# Patient Record
Sex: Female | Born: 1955 | Race: White | Hispanic: No | Marital: Married | Smoking: Never smoker
Health system: Southern US, Community
[De-identification: ages and names within clinical notes are randomized; demographics above are authoritative.]

## PROBLEM LIST (undated history)

## (undated) DIAGNOSIS — Z8489 Family history of other specified conditions: Secondary | ICD-10-CM

## (undated) DIAGNOSIS — Z85828 Personal history of other malignant neoplasm of skin: Secondary | ICD-10-CM

## (undated) DIAGNOSIS — K219 Gastro-esophageal reflux disease without esophagitis: Secondary | ICD-10-CM

## (undated) HISTORY — PX: ABDOMINAL HYSTERECTOMY: SHX81

## (undated) HISTORY — DX: Personal history of other malignant neoplasm of skin: Z85.828

## (undated) HISTORY — PX: SHOULDER SURGERY: SHX246

---

## 1978-04-07 DIAGNOSIS — O039 Complete or unspecified spontaneous abortion without complication: Secondary | ICD-10-CM

## 1978-04-07 HISTORY — DX: Complete or unspecified spontaneous abortion without complication: O03.9

## 1999-08-15 ENCOUNTER — Ambulatory Visit (HOSPITAL_COMMUNITY): Admission: RE | Admit: 1999-08-15 | Discharge: 1999-08-15 | Payer: Self-pay | Admitting: Obstetrics and Gynecology

## 2001-09-15 ENCOUNTER — Inpatient Hospital Stay (HOSPITAL_COMMUNITY): Admission: RE | Admit: 2001-09-15 | Discharge: 2001-09-17 | Payer: Self-pay | Admitting: Obstetrics and Gynecology

## 2004-05-23 ENCOUNTER — Ambulatory Visit: Payer: Self-pay | Admitting: Internal Medicine

## 2004-07-22 ENCOUNTER — Ambulatory Visit: Payer: Self-pay | Admitting: Internal Medicine

## 2005-05-06 ENCOUNTER — Ambulatory Visit: Payer: Self-pay | Admitting: Internal Medicine

## 2005-07-25 ENCOUNTER — Ambulatory Visit: Payer: Self-pay | Admitting: Internal Medicine

## 2005-10-23 ENCOUNTER — Ambulatory Visit: Payer: Self-pay | Admitting: Gastroenterology

## 2006-04-22 DIAGNOSIS — C4491 Basal cell carcinoma of skin, unspecified: Secondary | ICD-10-CM

## 2006-04-22 HISTORY — DX: Basal cell carcinoma of skin, unspecified: C44.91

## 2006-07-12 ENCOUNTER — Emergency Department: Payer: Self-pay | Admitting: Emergency Medicine

## 2006-07-13 ENCOUNTER — Ambulatory Visit: Payer: Self-pay | Admitting: Internal Medicine

## 2006-07-27 ENCOUNTER — Ambulatory Visit: Payer: Self-pay | Admitting: Diagnostic Radiology

## 2007-08-02 ENCOUNTER — Ambulatory Visit: Payer: Self-pay | Admitting: Internal Medicine

## 2008-01-19 ENCOUNTER — Ambulatory Visit: Payer: Self-pay | Admitting: Internal Medicine

## 2008-05-29 ENCOUNTER — Ambulatory Visit: Payer: Self-pay | Admitting: Surgery

## 2008-08-09 ENCOUNTER — Emergency Department: Payer: Self-pay | Admitting: Emergency Medicine

## 2008-11-06 ENCOUNTER — Ambulatory Visit: Payer: Self-pay | Admitting: Internal Medicine

## 2009-11-07 ENCOUNTER — Ambulatory Visit: Payer: Self-pay | Admitting: Internal Medicine

## 2010-04-07 HISTORY — PX: BREAST EXCISIONAL BIOPSY: SUR124

## 2010-11-27 ENCOUNTER — Ambulatory Visit: Payer: Self-pay | Admitting: Internal Medicine

## 2011-12-15 ENCOUNTER — Ambulatory Visit: Payer: Self-pay | Admitting: Internal Medicine

## 2012-12-15 ENCOUNTER — Ambulatory Visit: Payer: Self-pay | Admitting: Internal Medicine

## 2012-12-28 ENCOUNTER — Ambulatory Visit: Payer: Self-pay | Admitting: Internal Medicine

## 2013-07-26 DIAGNOSIS — D239 Other benign neoplasm of skin, unspecified: Secondary | ICD-10-CM

## 2013-07-26 HISTORY — DX: Other benign neoplasm of skin, unspecified: D23.9

## 2013-08-30 DIAGNOSIS — H269 Unspecified cataract: Secondary | ICD-10-CM

## 2013-08-30 DIAGNOSIS — M51369 Other intervertebral disc degeneration, lumbar region without mention of lumbar back pain or lower extremity pain: Secondary | ICD-10-CM

## 2013-08-30 DIAGNOSIS — M5136 Other intervertebral disc degeneration, lumbar region: Secondary | ICD-10-CM

## 2013-08-30 DIAGNOSIS — E785 Hyperlipidemia, unspecified: Secondary | ICD-10-CM

## 2013-08-30 DIAGNOSIS — M5090 Cervical disc disorder, unspecified, unspecified cervical region: Secondary | ICD-10-CM | POA: Insufficient documentation

## 2013-08-30 HISTORY — DX: Other intervertebral disc degeneration, lumbar region: M51.36

## 2013-08-30 HISTORY — DX: Unspecified cataract: H26.9

## 2013-08-30 HISTORY — DX: Other intervertebral disc degeneration, lumbar region without mention of lumbar back pain or lower extremity pain: M51.369

## 2013-08-30 HISTORY — DX: Hyperlipidemia, unspecified: E78.5

## 2013-08-30 HISTORY — DX: Cervical disc disorder, unspecified, unspecified cervical region: M50.90

## 2013-09-01 ENCOUNTER — Ambulatory Visit: Payer: Self-pay | Admitting: Internal Medicine

## 2014-07-12 ENCOUNTER — Ambulatory Visit
Admit: 2014-07-12 | Disposition: A | Discharge status: Home / Self Care | Payer: Self-pay | Attending: Diagnostic Radiology | Admitting: Diagnostic Radiology

## 2014-11-16 ENCOUNTER — Other Ambulatory Visit: Payer: Self-pay | Admitting: Internal Medicine

## 2014-11-16 DIAGNOSIS — R1011 Right upper quadrant pain: Secondary | ICD-10-CM

## 2014-11-22 ENCOUNTER — Ambulatory Visit
Admission: RE | Admit: 2014-11-22 | Discharge: 2014-11-22 | Disposition: A | Discharge status: Home / Self Care | Payer: BC Managed Care – PPO | Source: Ambulatory Visit | Attending: Internal Medicine | Admitting: Internal Medicine

## 2014-11-22 DIAGNOSIS — R1011 Right upper quadrant pain: Secondary | ICD-10-CM | POA: Diagnosis present

## 2014-11-22 DIAGNOSIS — K769 Liver disease, unspecified: Secondary | ICD-10-CM | POA: Insufficient documentation

## 2014-11-22 MED ORDER — TECHNETIUM TC 99M MEBROFENIN IV KIT
5.0000 | PACK | Freq: Once | INTRAVENOUS | Status: DC | PRN
  Administered 2014-11-22: 5.42 via INTRAVENOUS
  Filled 2014-11-22: qty 6

## 2014-11-22 MED ORDER — SINCALIDE 5 MCG IJ SOLR
0.0200 ug/kg | Freq: Once | INTRAMUSCULAR | Status: AC
  Administered 2014-11-22: 1.18 ug via INTRAVENOUS

## 2015-02-22 ENCOUNTER — Ambulatory Visit
Admission: RE | Admit: 2015-02-22 | Discharge: 2015-02-22 | Disposition: A | Discharge status: Home / Self Care | Payer: BC Managed Care – PPO | Source: Ambulatory Visit | Attending: Internal Medicine | Admitting: Internal Medicine

## 2015-02-22 ENCOUNTER — Other Ambulatory Visit: Payer: Self-pay | Admitting: Internal Medicine

## 2015-02-22 DIAGNOSIS — R1012 Left upper quadrant pain: Secondary | ICD-10-CM | POA: Insufficient documentation

## 2015-02-22 DIAGNOSIS — K7689 Other specified diseases of liver: Secondary | ICD-10-CM | POA: Insufficient documentation

## 2015-02-22 DIAGNOSIS — R1084 Generalized abdominal pain: Secondary | ICD-10-CM

## 2015-02-22 DIAGNOSIS — D1809 Hemangioma of other sites: Secondary | ICD-10-CM | POA: Diagnosis not present

## 2015-02-22 LAB — POCT I-STAT CREATININE: Creatinine, Ser: 0.9 mg/dL (ref 0.44–1.00)

## 2015-02-22 MED ORDER — IOHEXOL 350 MG/ML SOLN
100.0000 mL | Freq: Once | INTRAVENOUS | Status: AC | PRN
  Administered 2015-02-22: 100 mL via INTRAVENOUS

## 2015-05-22 DIAGNOSIS — K2210 Ulcer of esophagus without bleeding: Secondary | ICD-10-CM

## 2015-05-22 HISTORY — DX: Ulcer of esophagus without bleeding: K22.10

## 2015-08-17 ENCOUNTER — Other Ambulatory Visit: Payer: Self-pay | Admitting: Internal Medicine

## 2015-08-17 DIAGNOSIS — Z1231 Encounter for screening mammogram for malignant neoplasm of breast: Secondary | ICD-10-CM

## 2015-08-28 ENCOUNTER — Ambulatory Visit
Admission: RE | Admit: 2015-08-28 | Discharge: 2015-08-28 | Disposition: A | Discharge status: Home / Self Care | Payer: BC Managed Care – PPO | Source: Ambulatory Visit | Attending: Internal Medicine | Admitting: Internal Medicine

## 2015-08-28 DIAGNOSIS — Z1231 Encounter for screening mammogram for malignant neoplasm of breast: Secondary | ICD-10-CM | POA: Diagnosis not present

## 2016-08-11 DIAGNOSIS — E782 Mixed hyperlipidemia: Secondary | ICD-10-CM | POA: Insufficient documentation

## 2016-08-11 DIAGNOSIS — M818 Other osteoporosis without current pathological fracture: Secondary | ICD-10-CM | POA: Insufficient documentation

## 2016-08-11 HISTORY — DX: Other osteoporosis without current pathological fracture: M81.8

## 2016-08-12 ENCOUNTER — Other Ambulatory Visit: Payer: Self-pay | Admitting: Internal Medicine

## 2016-08-12 DIAGNOSIS — Z1231 Encounter for screening mammogram for malignant neoplasm of breast: Secondary | ICD-10-CM

## 2016-09-03 ENCOUNTER — Ambulatory Visit
Admission: RE | Admit: 2016-09-03 | Discharge: 2016-09-03 | Disposition: A | Discharge status: Home / Self Care | Payer: BC Managed Care – PPO | Source: Ambulatory Visit | Attending: Internal Medicine | Admitting: Internal Medicine

## 2016-09-03 DIAGNOSIS — Z1231 Encounter for screening mammogram for malignant neoplasm of breast: Secondary | ICD-10-CM | POA: Insufficient documentation

## 2017-02-17 ENCOUNTER — Other Ambulatory Visit: Payer: Self-pay | Admitting: Internal Medicine

## 2017-02-17 DIAGNOSIS — R1084 Generalized abdominal pain: Secondary | ICD-10-CM

## 2017-02-23 ENCOUNTER — Ambulatory Visit
Admission: RE | Admit: 2017-02-23 | Discharge: 2017-02-23 | Disposition: A | Discharge status: Home / Self Care | Payer: BC Managed Care – PPO | Source: Ambulatory Visit | Attending: Internal Medicine | Admitting: Internal Medicine

## 2017-02-23 DIAGNOSIS — R1084 Generalized abdominal pain: Secondary | ICD-10-CM | POA: Diagnosis not present

## 2017-02-23 DIAGNOSIS — D1803 Hemangioma of intra-abdominal structures: Secondary | ICD-10-CM | POA: Diagnosis not present

## 2017-02-23 DIAGNOSIS — K7689 Other specified diseases of liver: Secondary | ICD-10-CM | POA: Insufficient documentation

## 2017-10-14 ENCOUNTER — Other Ambulatory Visit: Payer: Self-pay | Admitting: Internal Medicine

## 2017-10-14 DIAGNOSIS — Z1231 Encounter for screening mammogram for malignant neoplasm of breast: Secondary | ICD-10-CM

## 2017-11-03 ENCOUNTER — Ambulatory Visit
Admission: RE | Admit: 2017-11-03 | Discharge: 2017-11-03 | Disposition: A | Discharge status: Home / Self Care | Payer: BC Managed Care – PPO | Source: Ambulatory Visit | Attending: Internal Medicine | Admitting: Internal Medicine

## 2017-11-03 DIAGNOSIS — Z1231 Encounter for screening mammogram for malignant neoplasm of breast: Secondary | ICD-10-CM | POA: Diagnosis not present

## 2017-11-05 HISTORY — PX: ROTATOR CUFF REPAIR: SHX139

## 2018-02-10 ENCOUNTER — Emergency Department: Payer: BC Managed Care – PPO

## 2018-02-10 ENCOUNTER — Emergency Department
Admission: EM | Admit: 2018-02-10 | Discharge: 2018-02-10 | Disposition: A | Discharge status: Home / Self Care | Payer: BC Managed Care – PPO | Attending: Emergency Medicine | Admitting: Emergency Medicine

## 2018-02-10 ENCOUNTER — Encounter: Payer: Self-pay | Admitting: Emergency Medicine

## 2018-02-10 ENCOUNTER — Other Ambulatory Visit: Payer: Self-pay

## 2018-02-10 DIAGNOSIS — S20211A Contusion of right front wall of thorax, initial encounter: Secondary | ICD-10-CM | POA: Diagnosis not present

## 2018-02-10 DIAGNOSIS — S0990XA Unspecified injury of head, initial encounter: Secondary | ICD-10-CM | POA: Diagnosis present

## 2018-02-10 DIAGNOSIS — Y9289 Other specified places as the place of occurrence of the external cause: Secondary | ICD-10-CM | POA: Insufficient documentation

## 2018-02-10 DIAGNOSIS — Y9302 Activity, running: Secondary | ICD-10-CM | POA: Diagnosis not present

## 2018-02-10 DIAGNOSIS — S0083XA Contusion of other part of head, initial encounter: Secondary | ICD-10-CM | POA: Diagnosis not present

## 2018-02-10 DIAGNOSIS — M7918 Myalgia, other site: Secondary | ICD-10-CM

## 2018-02-10 DIAGNOSIS — Y998 Other external cause status: Secondary | ICD-10-CM | POA: Insufficient documentation

## 2018-02-10 DIAGNOSIS — W01198A Fall on same level from slipping, tripping and stumbling with subsequent striking against other object, initial encounter: Secondary | ICD-10-CM | POA: Insufficient documentation

## 2018-02-10 MED ORDER — OXYCODONE-ACETAMINOPHEN 5-325 MG PO TABS
1.0000 | ORAL_TABLET | Freq: Once | ORAL | Status: AC
  Administered 2018-02-10: 1 via ORAL

## 2018-02-10 MED ORDER — OXYCODONE-ACETAMINOPHEN 5-325 MG PO TABS
ORAL_TABLET | ORAL | Status: AC
  Filled 2018-02-10: qty 1

## 2018-02-10 MED ORDER — LIDOCAINE 5 % EX PTCH
1.0000 | MEDICATED_PATCH | CUTANEOUS | Status: DC
  Administered 2018-02-10: 1 via TRANSDERMAL
  Filled 2018-02-10: qty 1

## 2018-02-10 MED ORDER — NAPROXEN 375 MG PO TABS: 375.0000 mg | ORAL_TABLET | Freq: Two times a day (BID) | ORAL | 0 refills | Status: DC

## 2018-02-10 MED ORDER — TRAMADOL HCL 50 MG PO TABS: 50.0000 mg | ORAL_TABLET | Freq: Two times a day (BID) | ORAL | 0 refills | Status: DC

## 2018-02-10 NOTE — ED Provider Notes (Signed)
[facility] Emergency Department Provider Note   ____________________________________________   First MD Initiated Contact with Patient 02/10/18 1057     (approximate)  I have reviewed the triage vital signs and the nursing notes.   HISTORY  Chief Complaint Fall; Headache; and Shoulder Injury    HPI Krystal Mcintosh is a 62 y.o. female female patient who was running outside and fell secondary to stepping on uneven pavement.  Patient fell on her right side striking the right shoulder, head, and right lateral ribs.  Patient discussed concern of right shoulder increased pain secondary to having reconstruction of the [facility] joint on November 09, 2017.  Patient denies LOC but has a large hematoma right frontal forehead.  Patient also has abrasion to her nose hand and right side of face.  Patient is not taking blood thinners.  Patient rates the pain as a 6/10.  Patient described the pain is "aching".  No palliative measures prior to arrival.   History reviewed. No pertinent past medical history.  There are no active problems to display for this patient.   Past Surgical History:  Procedure Laterality Date  . ABDOMINAL HYSTERECTOMY    . BREAST EXCISIONAL BIOPSY Left 2012   benign  . SHOULDER SURGERY      Prior to Admission medications   Medication Sig Start Date End Date Taking? Authorizing Provider  naproxen (NAPROSYN) 375 MG tablet Take 1 tablet (375 mg total) by mouth 2 (two) times daily with a meal. 02/10/18   Sable Feil, PA-C  traMADol (ULTRAM) 50 MG tablet Take 1 tablet (50 mg total) by mouth 2 (two) times daily. 02/10/18   Sable Feil, PA-C    Allergies Patient has no known allergies.  Family History  Problem Relation Age of Onset  . Breast cancer Maternal Aunt        great    Social History Social History   Tobacco Use  . Smoking status: Never Smoker  . Smokeless tobacco: Never Used  Substance Use Topics  . Alcohol use: Not  Currently  . Drug use: Not on file    Review of Systems Constitutional: No fever/chills Eyes: No visual changes. ENT: No sore throat. Cardiovascular: Denies chest pain. Respiratory: Denies shortness of breath. Gastrointestinal: No abdominal pain.  No nausea, no vomiting.  No diarrhea.  No constipation. Genitourinary: Negative for dysuria. Musculoskeletal: Right shoulder and upper rib pain. Skin: Negative for rash.  Facial abrasions and hematoma to the forehead. Neurological: Negative for headaches, focal weakness or numbness.   ____________________________________________   PHYSICAL EXAM:  VITAL SIGNS: ED Triage Vitals [02/10/18 1036]  Enc Vitals Group     BP 133/82     Pulse Rate (!) 102     Resp 20     Temp 98.6 F (37 C)     Temp Source Oral     SpO2 98 %     Weight 142 lb (64.4 kg)     Height 5\' 2"  (1.575 m)     Head Circumference      Peak Flow      Pain Score      Pain Loc      Pain Edu?      Excl. in [place]?    Constitutional: Alert and oriented. Well appearing and in no acute distress. Eyes: Conjunctivae are normal. PERRL. EOMI. Head: Atraumatic. Nose: No congestion/rhinnorhea. Mouth/Throat: Mucous membranes are moist.  Oropharynx non-erythematous. Neck:  No cervical spine tenderness to palpation. Cardiovascular: Normal  rate, regular rhythm. Grossly normal heart sounds.  Good peripheral circulation. Respiratory: Normal respiratory effort.  No retractions. Lungs CTAB. Gastrointestinal: Soft and nontender. No distention. No abdominal bruits. No CVA tenderness. Musculoskeletal: No obvious deformity to the right shoulder. Neurologic:  Normal speech and language. No gross focal neurologic deficits are appreciated. No gait instability. Skin:  Skin is warm, dry and intact. No rash noted.  Right forehead hematoma and multiple facial abrasions. Psychiatric: Mood and affect are normal. Speech and behavior are normal.  ____________________________________________     LABS (all labs ordered are listed, but only abnormal results are displayed)  Labs Reviewed - No data to display ____________________________________________  EKG   ____________________________________________  RADIOLOGY  ED MD interpretation:    Official radiology report(s): Dg Ribs Unilateral W/chest Right  Result Date: 02/10/2018 CLINICAL DATA:  Chest pain after fall. EXAM: RIGHT RIBS AND CHEST - 3+ VIEW COMPARISON:  None. FINDINGS: No fracture or other bone lesions are seen involving the ribs. There is no evidence of pneumothorax or pleural effusion. Both lungs are clear. Heart size and mediastinal contours are within normal limits. IMPRESSION: Negative. Electronically Signed   By: Marijo Conception, M.D.   On: 02/10/2018 13:21   Dg Shoulder Right  Result Date: 02/10/2018 CLINICAL DATA:  Right shoulder pain since an injury in a fall this morning. Initial encounter. EXAM: RIGHT SHOULDER - 2+ VIEW COMPARISON:  None. FINDINGS: There is no acute bony or joint abnormality. The patient is status post resection of the acromioclavicular joint without evidence of complication. Image right lung and ribs appear normal. IMPRESSION: No acute abnormality. Electronically Signed   By: Inge Rise M.D.   On: 02/10/2018 11:20   Ct Head Wo Contrast  Result Date: 02/10/2018 CLINICAL DATA:  Right for it injury facial injury after fall. EXAM: CT HEAD WITHOUT CONTRAST CT MAXILLOFACIAL WITHOUT CONTRAST TECHNIQUE: Multidetector CT imaging of the head and maxillofacial structures were performed using the standard protocol without intravenous contrast. Multiplanar CT image reconstructions of the maxillofacial structures were also generated. COMPARISON:  None. FINDINGS: CT HEAD FINDINGS Brain: No evidence of acute infarction, hemorrhage, hydrocephalus, extra-axial collection or mass lesion/mass effect. Vascular: No hyperdense vessel or unexpected calcification. Skull: Normal. Negative for fracture or focal  lesion. Other: Small right frontal scalp hematoma is noted. CT MAXILLOFACIAL FINDINGS Osseous: No fracture or mandibular dislocation. No destructive process. Orbits: Negative. No traumatic or inflammatory finding. Sinuses: Clear. Soft tissues: Negative. IMPRESSION: Small right frontal scalp hematoma. No acute intracranial abnormality seen. No abnormality seen in maxillofacial region. Electronically Signed   By: Marijo Conception, M.D.   On: 02/10/2018 12:20   Ct Maxillofacial Wo Contrast  Result Date: 02/10/2018 CLINICAL DATA:  Right for it injury facial injury after fall. EXAM: CT HEAD WITHOUT CONTRAST CT MAXILLOFACIAL WITHOUT CONTRAST TECHNIQUE: Multidetector CT imaging of the head and maxillofacial structures were performed using the standard protocol without intravenous contrast. Multiplanar CT image reconstructions of the maxillofacial structures were also generated. COMPARISON:  None. FINDINGS: CT HEAD FINDINGS Brain: No evidence of acute infarction, hemorrhage, hydrocephalus, extra-axial collection or mass lesion/mass effect. Vascular: No hyperdense vessel or unexpected calcification. Skull: Normal. Negative for fracture or focal lesion. Other: Small right frontal scalp hematoma is noted. CT MAXILLOFACIAL FINDINGS Osseous: No fracture or mandibular dislocation. No destructive process. Orbits: Negative. No traumatic or inflammatory finding. Sinuses: Clear. Soft tissues: Negative. IMPRESSION: Small right frontal scalp hematoma. No acute intracranial abnormality seen. No abnormality seen in maxillofacial region. Electronically Signed  By: Marijo Conception, M.D.   On: 02/10/2018 12:20    ____________________________________________   PROCEDURES  Procedure(s) performed: None  Procedures  Critical Care performed: No  ____________________________________________   INITIAL IMPRESSION / ASSESSMENT AND PLAN / ED COURSE  As part of my medical decision making, I reviewed the following data within the  [place]    Patient presents status post fall resulting in a hematoma to the right forehead and musculoskeletal pain to the right shoulder.  Patient also sustained rib contusion with no fractures.  Discussed CT and x-ray findings with patient.  Patient given discharge care instruction and advised to take medication as directed.  Patient will follow-up PCP.      ____________________________________________   FINAL CLINICAL IMPRESSION(S) / ED DIAGNOSES  Final diagnoses:  Contusion of face, initial encounter  Musculoskeletal pain  Rib contusion, right, initial encounter     ED Discharge Orders         Ordered    traMADol (ULTRAM) 50 MG tablet  2 times daily     02/10/18 1347    naproxen (NAPROSYN) 375 MG tablet  2 times daily with meals     02/10/18 1347           Note:  This document was prepared using Dragon voice recognition software and may include unintentional dictation errors.    Sable Feil, PA-C 02/10/18 1349    Eula Listen, MD 02/10/18 [id number]

## 2018-02-10 NOTE — ED Triage Notes (Signed)
Pt states she was running outside during exercise class, didn't realize the sidewalk was uneven, fell on right side hitting right shoulder and head, right shoulder surgery Aug 5th, 2019, denies use of blood thinners. Quarter sized knot noted to right forehead, small abrasions to nose, hands and right side of face.

## 2018-02-10 NOTE — Discharge Instructions (Signed)
Follow discharge care instruction take medication as directed. °

## 2018-02-10 NOTE — ED Notes (Signed)
Patient visibly shaking, complaining of falling while out walking this AM, has abrasions on hands and right forehead.  Given ice pack for forehead.  Alert and oriented.

## 2018-02-10 NOTE — ED Notes (Signed)
See triage note  Presents s/p fall  Tripped over uneven sidewalk  Landed on right shoulder and head   No LOC

## 2018-02-24 ENCOUNTER — Other Ambulatory Visit: Payer: Self-pay | Admitting: Physician Assistant

## 2018-02-24 DIAGNOSIS — M25511 Pain in right shoulder: Secondary | ICD-10-CM

## 2018-03-08 ENCOUNTER — Ambulatory Visit
Admission: RE | Admit: 2018-03-08 | Discharge: 2018-03-08 | Disposition: A | Discharge status: Home / Self Care | Payer: BC Managed Care – PPO | Source: Ambulatory Visit | Attending: Physician Assistant | Admitting: Physician Assistant

## 2018-03-08 DIAGNOSIS — M25511 Pain in right shoulder: Secondary | ICD-10-CM

## 2018-10-15 DIAGNOSIS — Z0000 Encounter for general adult medical examination without abnormal findings: Secondary | ICD-10-CM | POA: Insufficient documentation

## 2018-10-18 ENCOUNTER — Other Ambulatory Visit: Payer: Self-pay | Admitting: Internal Medicine

## 2018-10-18 DIAGNOSIS — Z1231 Encounter for screening mammogram for malignant neoplasm of breast: Secondary | ICD-10-CM

## 2018-11-22 ENCOUNTER — Other Ambulatory Visit: Payer: Self-pay

## 2018-11-22 ENCOUNTER — Ambulatory Visit
Admission: RE | Admit: 2018-11-22 | Discharge: 2018-11-22 | Disposition: A | Discharge status: Home / Self Care | Payer: Medicare Other | Source: Ambulatory Visit | Attending: Internal Medicine | Admitting: Internal Medicine

## 2018-11-22 DIAGNOSIS — Z1231 Encounter for screening mammogram for malignant neoplasm of breast: Secondary | ICD-10-CM | POA: Diagnosis not present

## 2019-06-27 DIAGNOSIS — M5136 Other intervertebral disc degeneration, lumbar region: Secondary | ICD-10-CM | POA: Insufficient documentation

## 2019-06-27 DIAGNOSIS — M419 Scoliosis, unspecified: Secondary | ICD-10-CM | POA: Insufficient documentation

## 2019-06-27 DIAGNOSIS — M51369 Other intervertebral disc degeneration, lumbar region without mention of lumbar back pain or lower extremity pain: Secondary | ICD-10-CM

## 2019-06-27 HISTORY — DX: Other intervertebral disc degeneration, lumbar region: M51.36

## 2019-06-27 HISTORY — DX: Other intervertebral disc degeneration, lumbar region without mention of lumbar back pain or lower extremity pain: M51.369

## 2019-06-27 HISTORY — DX: Scoliosis, unspecified: M41.9

## 2019-09-19 ENCOUNTER — Other Ambulatory Visit: Payer: Self-pay

## 2019-09-19 ENCOUNTER — Ambulatory Visit: Payer: Medicare PPO | Admitting: Dermatology

## 2019-09-19 DIAGNOSIS — Z86018 Personal history of other benign neoplasm: Secondary | ICD-10-CM | POA: Diagnosis not present

## 2019-09-19 DIAGNOSIS — D1801 Hemangioma of skin and subcutaneous tissue: Secondary | ICD-10-CM | POA: Diagnosis not present

## 2019-09-19 DIAGNOSIS — L820 Inflamed seborrheic keratosis: Secondary | ICD-10-CM | POA: Diagnosis not present

## 2019-09-19 DIAGNOSIS — K130 Diseases of lips: Secondary | ICD-10-CM

## 2019-09-19 DIAGNOSIS — I781 Nevus, non-neoplastic: Secondary | ICD-10-CM

## 2019-09-19 DIAGNOSIS — R238 Other skin changes: Secondary | ICD-10-CM

## 2019-09-19 DIAGNOSIS — B078 Other viral warts: Secondary | ICD-10-CM | POA: Diagnosis not present

## 2019-09-19 DIAGNOSIS — L720 Epidermal cyst: Secondary | ICD-10-CM | POA: Diagnosis not present

## 2019-09-19 DIAGNOSIS — Z85828 Personal history of other malignant neoplasm of skin: Secondary | ICD-10-CM

## 2019-09-19 NOTE — Progress Notes (Signed)
   Follow-Up Visit   Subjective  Krystal Mcintosh is a 63 y.o. female who presents for the following: Skin Problem.  Patient here today for a place on her left upper lip, has been red for 2-3 months. There is also a spot on corner of right lower lip. Patient also has a spot on left first finger that she would like looked at. She has a history of BCC, Dysplastic Nevi and is scheduled in September for her Annual Exam.  The following portions of the chart were reviewed this encounter and updated as appropriate:  Tobacco  Allergies  Meds  Problems  Med Hx  Surg Hx  Fam Hx      Review of Systems:  No other skin or systemic complaints except as noted in HPI or Assessment and Plan.  Objective  Well appearing patient in no apparent distress; mood and affect are within normal limits.  A focused examination was performed including hands, fingers, face. Relevant physical exam findings are noted in the Assessment and Plan.  Objective  Left Upper Lip: Dilated vessels  Objective  Right Oral Commissure: Smooth white papule(s).   Objective  Right oral commissure: Erythema of R oral commissure  Objective  Left index finger at DIP: Verrucous papules -- Discussed viral etiology and contagion.   Objective  Right Temple x 10, L face x 11 (21): Erythematous keratotic or waxy stuck-on papule or plaque.   Objective  Lower Lip: Purple macule   Assessment & Plan    Telangiectasia Left Upper Lip  Milia Right Oral Commissure  Benign, observe.     Cheilitis Right oral commissure With erythema.  Discussed hyaluronic acid fillers. Benign, observe.    Other viral warts Left index finger at DIP  Destruction of lesion - Left index finger at DIP Complexity: simple   Destruction method: cryotherapy   Informed consent: discussed and consent obtained   Timeout:  patient name, date of birth, surgical site, and procedure verified Lesion destroyed using liquid nitrogen: Yes     Region frozen until ice ball extended beyond lesion: Yes   Outcome: patient tolerated procedure well with no complications   Post-procedure details: wound care instructions given    Inflamed seborrheic keratosis (21) Right Temple x 10, L face x 11  Destruction of lesion - Right Temple x 10, L face x 11 Complexity: simple   Destruction method: cryotherapy   Informed consent: discussed and consent obtained   Timeout:  patient name, date of birth, surgical site, and procedure verified Lesion destroyed using liquid nitrogen: Yes   Region frozen until ice ball extended beyond lesion: Yes   Outcome: patient tolerated procedure well with no complications   Post-procedure details: wound care instructions given    Venous lake Lower Lip  Benign, observe.    Return as scheduled, for TBSE.  Graciella Belton, RMA, am acting as scribe for [name], MD . Documentation: I have reviewed the above documentation for accuracy and completeness, and I agree with the above.  [name], MD

## 2019-09-19 NOTE — Patient Instructions (Addendum)
Recommend daily broad spectrum sunscreen SPF 30+ to sun-exposed areas, reapply every 2 hours as needed. Call for new or changing lesions.  Cryotherapy Aftercare  . Wash gently with soap and water everyday.   . Apply Vaseline and Band-Aid daily until healed.  

## 2019-09-21 ENCOUNTER — Encounter: Payer: Self-pay | Admitting: Dermatology

## 2019-11-07 ENCOUNTER — Other Ambulatory Visit: Payer: Self-pay | Admitting: Internal Medicine

## 2019-11-07 DIAGNOSIS — Z1231 Encounter for screening mammogram for malignant neoplasm of breast: Secondary | ICD-10-CM

## 2019-11-25 ENCOUNTER — Ambulatory Visit
Admission: RE | Admit: 2019-11-25 | Discharge: 2019-11-25 | Disposition: A | Discharge status: Home / Self Care | Payer: Medicare PPO | Source: Ambulatory Visit | Attending: Internal Medicine | Admitting: Internal Medicine

## 2019-11-25 ENCOUNTER — Other Ambulatory Visit: Payer: Self-pay

## 2019-11-25 DIAGNOSIS — Z1231 Encounter for screening mammogram for malignant neoplasm of breast: Secondary | ICD-10-CM | POA: Insufficient documentation

## 2019-12-08 ENCOUNTER — Encounter: Payer: Self-pay | Admitting: Dermatology

## 2019-12-08 ENCOUNTER — Other Ambulatory Visit: Payer: Self-pay

## 2019-12-08 ENCOUNTER — Ambulatory Visit: Payer: Medicare PPO | Admitting: Dermatology

## 2019-12-08 DIAGNOSIS — L720 Epidermal cyst: Secondary | ICD-10-CM | POA: Diagnosis not present

## 2019-12-08 DIAGNOSIS — L820 Inflamed seborrheic keratosis: Secondary | ICD-10-CM | POA: Diagnosis not present

## 2019-12-08 DIAGNOSIS — Z85828 Personal history of other malignant neoplasm of skin: Secondary | ICD-10-CM | POA: Diagnosis not present

## 2019-12-08 DIAGNOSIS — L821 Other seborrheic keratosis: Secondary | ICD-10-CM

## 2019-12-08 DIAGNOSIS — D1800 Hemangioma unspecified site: Secondary | ICD-10-CM

## 2019-12-08 DIAGNOSIS — L814 Other melanin hyperpigmentation: Secondary | ICD-10-CM

## 2019-12-08 DIAGNOSIS — L578 Other skin changes due to chronic exposure to nonionizing radiation: Secondary | ICD-10-CM

## 2019-12-08 DIAGNOSIS — Z1283 Encounter for screening for malignant neoplasm of skin: Secondary | ICD-10-CM | POA: Diagnosis not present

## 2019-12-08 DIAGNOSIS — D229 Melanocytic nevi, unspecified: Secondary | ICD-10-CM

## 2019-12-08 DIAGNOSIS — Z86018 Personal history of other benign neoplasm: Secondary | ICD-10-CM

## 2019-12-08 NOTE — Progress Notes (Signed)
Follow-Up Visit   Subjective  Krystal Mcintosh is a 64 y.o. female who presents for the following: Annual Exam (Hx BCC and dysplastic nevi - patient has irritated lesions around her breast and in her scalp that she would like checked). The patient presents for Total-Body Skin Exam (TBSE) for skin cancer screening and mole check.  The following portions of the chart were reviewed this encounter and updated as appropriate:  Tobacco  Allergies  Meds  Problems  Med Hx  Surg Hx  Fam Hx     Review of Systems:  No other skin or systemic complaints except as noted in HPI or Assessment and Plan.  Objective  Well appearing patient in no apparent distress; mood and affect are within normal limits.  A full examination was performed including scalp, head, eyes, ears, nose, lips, neck, chest, axillae, abdomen, back, buttocks, bilateral upper extremities, bilateral lower extremities, hands, feet, fingers, toes, fingernails, and toenails. All findings within normal limits unless otherwise noted below.  Objective  scalp x 3, L neck x 22, inframammary x 10 (35): Erythematous keratotic or waxy stuck-on papule or plaque.   Objective  R nose: Smooth white papule(s).   Objective  R med calf: Scar with no evidence of recurrence.   Assessment & Plan  Inflamed seborrheic keratosis (35) scalp x 3, L neck x 22, inframammary x 10  Destruction of lesion - scalp x 3, L neck x 22, inframammary x 10 Complexity: simple   Destruction method: cryotherapy   Informed consent: discussed and consent obtained   Timeout:  patient name, date of birth, surgical site, and procedure verified Lesion destroyed using liquid nitrogen: Yes   Region frozen until ice ball extended beyond lesion: Yes   Outcome: patient tolerated procedure well with no complications   Post-procedure details: wound care instructions given    Milium R nose  Benign, observe.    History of dysplastic nevus R med calf  Clear.  Observe for recurrence. Call clinic for new or changing lesions.  Recommend regular skin exams, daily broad-spectrum spf 30+ sunscreen use, and photoprotection.     Skin cancer screening   Lentigines - Scattered tan macules - Discussed due to sun exposure - Benign, observe - Call for any changes  Seborrheic Keratoses - Stuck-on, waxy, tan-brown papules and plaques  - Discussed benign etiology and prognosis. - Observe - Call for any changes  Melanocytic Nevi - Tan-brown and/or pink-flesh-colored symmetric macules and papules - Benign appearing on exam today - Observation - Call clinic for new or changing moles - Recommend daily use of broad spectrum spf 30+ sunscreen to sun-exposed areas.   Hemangiomas - Red papules - Discussed benign nature - Observe - Call for any changes  Actinic Damage - diffuse scaly erythematous macules with underlying dyspigmentation - Recommend daily broad spectrum sunscreen SPF 30+ to sun-exposed areas, reapply every 2 hours as needed.  - Call for new or changing lesions.  History of Basal Cell Carcinoma of the Skin - No evidence of recurrence today - Recommend regular full body skin exams - Recommend daily broad spectrum sunscreen SPF 30+ to sun-exposed areas, reapply every 2 hours as needed.  - Call if any new or changing lesions are noted between office visits  Skin cancer screening performed today.  Return in about 1 year (around 12/07/2020) for TBSE.  Luther Redo, CMA, am acting as scribe for [name], MD .  Documentation: I have reviewed the above documentation for accuracy and completeness,  and I agree with the above.  [name], MD

## 2019-12-14 ENCOUNTER — Encounter: Payer: Self-pay | Admitting: Dermatology

## 2020-03-14 DIAGNOSIS — F330 Major depressive disorder, recurrent, mild: Secondary | ICD-10-CM

## 2020-03-14 HISTORY — DX: Major depressive disorder, recurrent, mild: F33.0

## 2020-04-11 ENCOUNTER — Other Ambulatory Visit: Payer: Self-pay

## 2020-04-11 ENCOUNTER — Ambulatory Visit: Payer: Medicare PPO | Admitting: Dermatology

## 2020-04-11 DIAGNOSIS — L820 Inflamed seborrheic keratosis: Secondary | ICD-10-CM

## 2020-04-11 DIAGNOSIS — L851 Acquired keratosis [keratoderma] palmaris et plantaris: Secondary | ICD-10-CM | POA: Diagnosis not present

## 2020-04-11 DIAGNOSIS — L578 Other skin changes due to chronic exposure to nonionizing radiation: Secondary | ICD-10-CM

## 2020-04-11 NOTE — Progress Notes (Unsigned)
   Follow-Up Visit   Subjective  Krystal Mcintosh is a 65 y.o. female who presents for the following: Lesions (In the scalp - irritated, patient would like them treated ).  The following portions of the chart were reviewed this encounter and updated as appropriate:   Tobacco  Allergies  Meds  Problems  Med Hx  Surg Hx  Fam Hx     Review of Systems:  No other skin or systemic complaints except as noted in HPI or Assessment and Plan.  Objective  Well appearing patient in no apparent distress; mood and affect are within normal limits.  A focused examination was performed including the scalp. Relevant physical exam findings are noted in the Assessment and Plan.  Objective  Scalp (12): Erythematous keratotic or waxy stuck-on papule or plaque.   Assessment & Plan  Inflamed seborrheic keratosis (12) Scalp  Destruction of lesion - Scalp Complexity: simple   Destruction method: cryotherapy   Informed consent: discussed and consent obtained   Timeout:  patient name, date of birth, surgical site, and procedure verified Lesion destroyed using liquid nitrogen: Yes   Region frozen until ice ball extended beyond lesion: Yes   Outcome: patient tolerated procedure well with no complications   Post-procedure details: wound care instructions given     Seborrheic Keratoses - Stuck-on, waxy, tan-brown papules and plaques  - Discussed benign etiology and prognosis. - Observe - Call for any changes  Actinic Damage - chronic, secondary to cumulative UV radiation exposure/sun exposure over time - diffuse scaly erythematous macules with underlying dyspigmentation - Recommend daily broad spectrum sunscreen SPF 30+ to sun-exposed areas, reapply every 2 hours as needed.  - Call for new or changing lesions.  Return for appointment as scheduled.  Maylene Roes, CMA, am acting as scribe for Armida Sans, MD .  Documentation: I have reviewed the above documentation for accuracy and  completeness, and I agree with the above.  Armida Sans, MD

## 2020-04-12 ENCOUNTER — Encounter: Payer: Self-pay | Admitting: Dermatology

## 2020-07-10 ENCOUNTER — Ambulatory Visit: Payer: Medicare PPO | Admitting: Podiatry

## 2020-07-10 ENCOUNTER — Other Ambulatory Visit: Payer: Self-pay

## 2020-07-10 DIAGNOSIS — M79674 Pain in right toe(s): Secondary | ICD-10-CM

## 2020-07-10 DIAGNOSIS — L600 Ingrowing nail: Secondary | ICD-10-CM

## 2020-07-10 NOTE — Progress Notes (Signed)
   Subjective: Patient presents today for evaluation of pain to the lateral border of the right great toe. Patient is concerned for possible ingrown nail.  She states that it is very painful for a few weeks now.  However over the last few days she has been applying some ointment which is helped.  It is currently not very painful today.  Patient presents today for further treatment and evaluation.  Past Medical History:  Diagnosis Date  . Basal cell carcinoma 04/22/2006   right med lower leg above ankle  . Basal cell carcinoma 06/29/2007   right chest mid parasternal  . Basal cell carcinoma 01/25/2014   right medial pretibial  . Dysplastic nevus 07/26/2013   right medial calf    Objective:  General: Well developed, nourished, in no acute distress, alert and oriented x3   Dermatology: Skin is warm, dry and supple bilateral.  There are some very mild built-up subungual debris to the lateral aspect of the right great toe with associated tenderness to palpation.  No erythema or inflammation noted.  No drainage noted.  Vascular: Dorsalis Pedis artery and Posterior Tibial artery pedal pulses palpable. No lower extremity edema noted.   Neruologic: Grossly intact via light touch bilateral.  Musculoskeletal: Muscular strength within normal limits in all groups bilateral. Normal range of motion noted to all pedal and ankle joints.   Assesement: #1 Paronychia with ingrowing nail and built-up subungual debris lateral border right great toe #2 Pain in toe   Plan of Care:  1. Patient evaluated.  2.  Decision was made today to proceed with very conservative care.  Light debridement of the subungual debris and the offending border of the nail plate were performed using a tissue nipper without incident or bleeding.  The patient felt immediate relief 3.  Continue antibiotic ointment to the lateral border of the right great toe as needed 4.  Recommend wearing shoes that are wide fitting and do not  constrict the toebox area 5.  Return to clinic as needed.  If the patient does not improve when she returns we will may need to proceed with partial nail matricectomy  *Going to [employer] 3x/week to babysit her first grandchild.  Her daughter is a night shift RN in [employer], [place] [facility]  Dr. Edrick Kins, DPM    [address], [place] [address]                Office [phone]  Fax [phone]

## 2020-11-12 ENCOUNTER — Other Ambulatory Visit: Payer: Self-pay | Admitting: Internal Medicine

## 2020-11-12 DIAGNOSIS — Z1231 Encounter for screening mammogram for malignant neoplasm of breast: Secondary | ICD-10-CM

## 2020-11-29 ENCOUNTER — Ambulatory Visit
Admission: RE | Admit: 2020-11-29 | Discharge: 2020-11-29 | Disposition: A | Discharge status: Home / Self Care | Payer: Medicare PPO | Source: Ambulatory Visit | Attending: Internal Medicine | Admitting: Internal Medicine

## 2020-11-29 ENCOUNTER — Other Ambulatory Visit: Payer: Self-pay

## 2020-11-29 ENCOUNTER — Ambulatory Visit: Payer: Medicare PPO | Admitting: Dermatology

## 2020-11-29 DIAGNOSIS — Z1283 Encounter for screening for malignant neoplasm of skin: Secondary | ICD-10-CM | POA: Diagnosis not present

## 2020-11-29 DIAGNOSIS — Z85828 Personal history of other malignant neoplasm of skin: Secondary | ICD-10-CM | POA: Diagnosis not present

## 2020-11-29 DIAGNOSIS — L820 Inflamed seborrheic keratosis: Secondary | ICD-10-CM

## 2020-11-29 DIAGNOSIS — Z1231 Encounter for screening mammogram for malignant neoplasm of breast: Secondary | ICD-10-CM | POA: Diagnosis not present

## 2020-11-29 DIAGNOSIS — L821 Other seborrheic keratosis: Secondary | ICD-10-CM

## 2020-11-29 DIAGNOSIS — Z86018 Personal history of other benign neoplasm: Secondary | ICD-10-CM

## 2020-11-29 DIAGNOSIS — L578 Other skin changes due to chronic exposure to nonionizing radiation: Secondary | ICD-10-CM | POA: Diagnosis not present

## 2020-11-29 DIAGNOSIS — L814 Other melanin hyperpigmentation: Secondary | ICD-10-CM

## 2020-11-29 DIAGNOSIS — D1800 Hemangioma unspecified site: Secondary | ICD-10-CM

## 2020-11-29 DIAGNOSIS — D229 Melanocytic nevi, unspecified: Secondary | ICD-10-CM

## 2020-11-29 NOTE — Progress Notes (Signed)
Follow-Up Visit   Subjective  Krystal Mcintosh is a 65 y.o. female who presents for the following: Annual Exam (Mole check ). Hx of BCC, Hx of Dysplastic nevus. Pt c/o itchy growths on her scalp she would like removed today.  The patient presents for Total-Body Skin Exam (TBSE) for skin cancer screening and mole check.   The following portions of the chart were reviewed this encounter and updated as appropriate:   Tobacco  Allergies  Meds  Problems  Med Hx  Surg Hx  Fam Hx      Review of Systems:  No other skin or systemic complaints except as noted in HPI or Assessment and Plan.  Objective  Well appearing patient in no apparent distress; mood and affect are within normal limits.  A full examination was performed including scalp, head, eyes, ears, nose, lips, neck, chest, axillae, abdomen, back, buttocks, bilateral upper extremities, bilateral lower extremities, hands, feet, fingers, toes, fingernails, and toenails. All findings within normal limits unless otherwise noted below.  Scalp, forehead x 21 (21) Erythematous keratotic or waxy stuck-on papule or plaque.    Assessment & Plan  Inflamed seborrheic keratosis Scalp, forehead x 21  Destruction of lesion - Scalp, forehead x 21 Complexity: simple   Destruction method: cryotherapy   Informed consent: discussed and consent obtained   Timeout:  patient name, date of birth, surgical site, and procedure verified Lesion destroyed using liquid nitrogen: Yes   Region frozen until ice ball extended beyond lesion: Yes   Outcome: patient tolerated procedure well with no complications   Post-procedure details: wound care instructions given    Lentigines - Scattered tan macules - Due to sun exposure - Benign-appering, observe - Recommend daily broad spectrum sunscreen SPF 30+ to sun-exposed areas, reapply every 2 hours as needed. - Call for any changes  Seborrheic Keratoses - Stuck-on, waxy, tan-brown papules and/or  plaques  - Benign-appearing - Discussed benign etiology and prognosis. - Observe - Call for any changes  Melanocytic Nevi - Tan-brown and/or pink-flesh-colored symmetric macules and papules - Benign appearing on exam today - Observation - Call clinic for new or changing moles - Recommend daily use of broad spectrum spf 30+ sunscreen to sun-exposed areas.   Hemangiomas - Red papules - Discussed benign nature - Observe - Call for any changes  Actinic Damage - Chronic condition, secondary to cumulative UV/sun exposure - diffuse scaly erythematous macules with underlying dyspigmentation - Recommend daily broad spectrum sunscreen SPF 30+ to sun-exposed areas, reapply every 2 hours as needed.  - Staying in the shade or wearing long sleeves, sun glasses (UVA+UVB protection) and wide brim hats (4-inch brim around the entire circumference of the hat) are also recommended for sun protection.  - Call for new or changing lesions.  History of Basal Cell Carcinoma of the Skin See history  - No evidence of recurrence today - Recommend regular full body skin exams - Recommend daily broad spectrum sunscreen SPF 30+ to sun-exposed areas, reapply every 2 hours as needed.  - Call if any new or changing lesions are noted between office visits   History of Dysplastic Nevi See history  - No evidence of recurrence today - Recommend regular full body skin exams - Recommend daily broad spectrum sunscreen SPF 30+ to sun-exposed areas, reapply every 2 hours as needed.  - Call if any new or changing lesions are noted between office visits  Skin cancer screening performed today.   Return in about 1 year (around 11/29/2021)  for TBSE, hx of skin cancers, 6 months recheck ISK .  I, Krystal Mcintosh, CMA, am acting as scribe for Krystal Ser, MD .  Documentation: I have reviewed the above documentation for accuracy and completeness, and I agree with the above.  Krystal Ser, MD

## 2020-11-29 NOTE — Patient Instructions (Addendum)

## 2020-12-03 ENCOUNTER — Encounter: Payer: Self-pay | Admitting: Dermatology

## 2020-12-12 ENCOUNTER — Ambulatory Visit: Payer: Medicare PPO | Admitting: Dermatology

## 2021-03-28 ENCOUNTER — Ambulatory Visit: Payer: Medicare PPO | Attending: Internal Medicine | Admitting: Physical Therapy

## 2021-03-28 DIAGNOSIS — M5442 Lumbago with sciatica, left side: Secondary | ICD-10-CM | POA: Insufficient documentation

## 2021-03-28 DIAGNOSIS — R2689 Other abnormalities of gait and mobility: Secondary | ICD-10-CM | POA: Insufficient documentation

## 2021-03-28 DIAGNOSIS — M533 Sacrococcygeal disorders, not elsewhere classified: Secondary | ICD-10-CM | POA: Insufficient documentation

## 2021-03-28 DIAGNOSIS — M6208 Separation of muscle (nontraumatic), other site: Secondary | ICD-10-CM | POA: Insufficient documentation

## 2021-03-28 DIAGNOSIS — G8929 Other chronic pain: Secondary | ICD-10-CM | POA: Insufficient documentation

## 2021-04-02 ENCOUNTER — Ambulatory Visit: Payer: Medicare PPO | Admitting: Physical Therapy

## 2021-04-02 ENCOUNTER — Other Ambulatory Visit: Payer: Self-pay

## 2021-04-02 ENCOUNTER — Encounter: Payer: Self-pay | Admitting: Physical Therapy

## 2021-04-02 DIAGNOSIS — M5442 Lumbago with sciatica, left side: Secondary | ICD-10-CM | POA: Diagnosis present

## 2021-04-02 DIAGNOSIS — R2689 Other abnormalities of gait and mobility: Secondary | ICD-10-CM

## 2021-04-02 DIAGNOSIS — M533 Sacrococcygeal disorders, not elsewhere classified: Secondary | ICD-10-CM | POA: Diagnosis present

## 2021-04-02 DIAGNOSIS — G8929 Other chronic pain: Secondary | ICD-10-CM | POA: Diagnosis present

## 2021-04-02 DIAGNOSIS — M6208 Separation of muscle (nontraumatic), other site: Secondary | ICD-10-CM

## 2021-04-02 NOTE — Therapy (Signed)
[place] MAIN [facility] SERVICES [address] [place], [place], [address] Phone: [phone]   Fax:  [phone]  Physical Therapy Evaluation  Patient Details  Name: Krystal Mcintosh MRN: [id number] Date of Birth: [birth year] Referring Provider (PT): Sabra Heck MD   Encounter Date: 04/02/2021   PT End of Session - 04/02/21 1551     Visit Number 1    Number of Visits 10    Date for PT Re-Evaluation 06/11/21    PT Start Time 1508    PT Stop Time 1602    PT Time Calculation (min) 54 min    Activity Tolerance Patient tolerated treatment well    Behavior During Therapy [facility] for tasks assessed/performed             Past Medical History:  Diagnosis Date   Basal cell carcinoma 04/22/2006   right med lower leg above ankle   Basal cell carcinoma 06/29/2007   right chest mid parasternal   Basal cell carcinoma 01/25/2014   right medial pretibial   Dysplastic nevus 07/26/2013   right medial calf    Past Surgical History:  Procedure Laterality Date   ABDOMINAL HYSTERECTOMY     BREAST EXCISIONAL BIOPSY Left 2012   benign   SHOULDER SURGERY      There were no vitals filed for this visit.    Subjective Assessment - 04/02/21 1515     Subjective 1) Urinary Leakage: Pt has started to wear urinary pads for the past 3 weeks. Pt changed pads pmce a day. Today, pt experiecned the worst episode of leakage where she waited too longto urinate and wetted through her pants.  Daily fluid intake: 32 fl oz water, decaf  24 fl of tea, 8 oz of coffee. leakage also occurs with coughing, sneezing, laughing. denied fecal leakage. Pt also notices leakage when standing up with grandson ( 19 lbs) from floor.   Pt has always walked and video exercises. Pt started back watching videos for a walking program.     2) LBP: Pt received a shot on November 1 which her 4th shot for LBP. Prior to the shot, pt had pain with sit,stand, walk. Pain radiates at the side of her L leg  to foot when she has overdone her activities like cleaning on her hands and knees and cleaning all day. 7/10 level of pain. Pain eases with sitting and lying down. Pertinent Hx: 2 vaginal deliveries with large heads with perineal tears, abdominal hysterectomy, scoliosis. Daily bowel movements without straining. Pt trakes Miralax    Pertinent History Basal cell carcinoma on multiple areas of skin, abdominal hysterectomy, scoliosis    Patient Stated Goals slow leakage down and help her back pain                [facility] PT Assessment - 04/02/21 1512       Assessment   Medical Diagnosis SUI    Referring Provider (PT) Sabra Heck MD      Precautions   Precautions None      Restrictions   Weight Bearing Restrictions No      Balance Screen   Has the patient fallen in the past 6 months No      Observation/Other Assessments   Scoliosis R thoracic convex, R shoulder higher, L iliac crest higher standing,.      AROM   Overall AROM Comments L sideflexion + pain(  post Tx: no pain), all directions [place]      Strength  Overall Strength Comments B hip / knee flex/ext 4/5, L hip abd 3/5, R hip abd 4/5      Palpation   Spinal mobility hypomobile T/L junction, convex curve R,    SI assessment  supine: L ASIS/ malleoli malleoli  higher/      Ambulation/Gait   Gait velocity pre Tx: 1.08 m/s  post Tx: 1.18 m/s    Gait Comments L hip hike pre Tx, reciporcal gait pattern post Tx                        Objective measurements completed on examination: See above findings.     Pelvic Floor Special Questions - 04/02/21 1532     Diastasis Recti 3 fingers below umbilicus              [facility] Adult PT Treatment/Exercise - 04/02/21 1512       Modalities   Modalities Moist Heat (P)       Manual Therapy   Manual therapy comments STM/MWM at T/L junction,R medial scapular to promote mobility. diaphragmatic excursion (P)                           PT Long Term Goals  - 04/02/21 1521       PT LONG TERM GOAL #1   Title . Pt will demo proper technique with floor to stand t/f with  grandson ( 19 lbs) and report no leakage    Time 4    Period Weeks    Status New    Target Date 04/30/21      PT LONG TERM GOAL #2   Title Pt will demo levelled pelvic girdle and less convex thoracic curve and leveleld shoulder height and increased gait speed from 1.09 m/s to > 1.3 m/s    Baseline L iliac crest / R shoulder higher    Time 6    Period Weeks    Status New    Target Date 05/14/21      PT LONG TERM GOAL #3   Title Pt will demo decreased DRA separation from 3 fingers width to < 1 fingers width to improve IAP system for continence    Time 8    Period Weeks    Status New    Target Date 05/28/21      PT LONG TERM GOAL #4   Title Pt will demo increased R      hipabduction from 3/5 to > 4/5 in order to improve pelvic girdle stability for IAP system efficiency and continence    Time 8    Period Weeks    Status New    Target Date 05/28/21      PT LONG TERM GOAL #5   Title Pt will demo iomproved FOTO score for PFDI Urinary from 45 ptsto > 55 pts and Prolapse from 58 pts to < 53 pts ino rder to imrpoveQOL    Time 10    Period Weeks    Status New    Target Date 06/11/21      Additional Long Term Goals   Additional Long Term Goals Yes      PT LONG TERM GOAL #6   Title Pt will demo decreased abdominal scar restrictions and  proper technique with deep core coordination and pelvic floor without cues to improve continence    Time 6    Period Weeks    Status New  Target Date 05/14/21                    Plan - 04/02/21 1552     Clinical Impression Statement  Pt is a  65  yo  who presents with urinary leakage and CLBP which impacts her ADLs and QOL.   Pt's musculoskeletal assessment revealed uneven pelvic alignment and spinal curves, limited spinal /pelvic mobility, restricted abdominal scars, dyscoordination and strength of pelvic floor mm,  weak R hip abduction, diastasis recti, and poor body mechanics which places strain on the abdominal/pelvic floor mm.   These are deficits that indicate an ineffective intraabdominal pressure system associated with increased risk for pt's Sx.  Contributing factors include: Hx of 2 vaginal deliveries with large heads with perineal tears, abdominal hysterectomy, and    scoliosis  Pt was provided education on etiology of Sx with anatomy, physiology explanation with images along with the benefits of customized pelvic PT Tx based on pt's medical conditions and musculoskeletal deficits.  Explained the physiology of deep core mm coordination and roles of pelvic floor function in urination, defecation, sexual function, and postural control with deep core mm system.   Following Tx today which pt tolerated without complaints, pt demo'd equal alignment of pelvic girdle and increased gait speed and less gait deviations. Plan to progress to  deep core coordination HEP at next session. Pt benefit from skilled PT.    Examination-Activity Limitations Continence;Carry;Toileting;Transfers;Lift;Stand    Stability/Clinical Decision Making Evolving/Moderate complexity    Clinical Decision Making Moderate    Rehab Potential Good    PT Frequency 1x / week    PT Duration Other (comment)   10   PT Treatment/Interventions Stair training;Functional mobility training;Therapeutic activities;Therapeutic exercise;Patient/family education;Taping;Joint Manipulations;Dry needling;Manual techniques;Wheelchair mobility training;Neuromuscular re-education;Gait training;Scar mobilization;Balance training;ADLs/Self Care Home Management;Moist Heat    Consulted and Agree with Plan of Care Patient             Patient will benefit from skilled therapeutic intervention in order to improve the following deficits and impairments:  Decreased activity tolerance, Decreased balance, Decreased endurance, Decreased range of motion, Decreased  safety awareness, Difficulty walking, Decreased strength, Impaired flexibility, Hypomobility, Decreased mobility, Decreased coordination, Abnormal gait, Increased fascial restricitons, Impaired sensation, Improper body mechanics, Pain, Increased muscle spasms, Decreased scar mobility, Postural dysfunction  Visit Diagnosis: Other abnormalities of gait and mobility  Sacrococcygeal disorders, not elsewhere classified  Diastasis recti  Chronic left-sided low back pain with left-sided sciatica     Problem List Patient Active Problem List   Diagnosis Date Noted   Major depressive disorder, recurrent, mild ([place]) 03/14/2020   Degeneration of lumbar intervertebral disc 06/27/2019   Scoliosis deformity of spine 06/27/2019   Medicare annual wellness visit, initial 10/15/2018   Adult idiopathic generalized osteoporosis 08/11/2016   Hyperlipidemia, mixed 08/11/2016   Erosive esophagitis 05/22/2015   Cervical disc disease 08/30/2013    Jerl Mina, PT 04/02/2021, 5:57 PM  [place] MAIN [facility] SERVICES [address] [place], [place], [address] Phone: [phone]   Fax:  [phone]  Name: Krystal Mcintosh MRN: [id number] Date of Birth: [birth year]

## 2021-04-02 NOTE — Patient Instructions (Signed)
°  Lengthen Back rib by R  shoulder    Lie on L  side , pillow between knees and under head  Pull  arm overhead over mattress, grab the edge of mattress,pull it upward, drawing elbow away from ears  Breathing 10 reps  Open book (handout)  Lying on  _ side , rotating  __ only this week  Rotating onto pillow /yoga block  Pillow/ Block between knees  10 reps

## 2021-04-10 ENCOUNTER — Encounter: Payer: Medicare PPO | Admitting: Physical Therapy

## 2021-04-17 ENCOUNTER — Encounter: Payer: Medicare PPO | Admitting: Physical Therapy

## 2021-04-18 ENCOUNTER — Encounter: Payer: Medicare PPO | Admitting: Physical Therapy

## 2021-04-18 ENCOUNTER — Other Ambulatory Visit: Payer: Self-pay

## 2021-04-18 ENCOUNTER — Ambulatory Visit: Payer: Medicare PPO | Attending: Internal Medicine | Admitting: Physical Therapy

## 2021-04-18 DIAGNOSIS — R2689 Other abnormalities of gait and mobility: Secondary | ICD-10-CM | POA: Insufficient documentation

## 2021-04-18 DIAGNOSIS — M5442 Lumbago with sciatica, left side: Secondary | ICD-10-CM | POA: Insufficient documentation

## 2021-04-18 DIAGNOSIS — G8929 Other chronic pain: Secondary | ICD-10-CM | POA: Diagnosis present

## 2021-04-18 DIAGNOSIS — M6208 Separation of muscle (nontraumatic), other site: Secondary | ICD-10-CM | POA: Insufficient documentation

## 2021-04-18 DIAGNOSIS — M533 Sacrococcygeal disorders, not elsewhere classified: Secondary | ICD-10-CM | POA: Insufficient documentation

## 2021-04-18 NOTE — Therapy (Signed)
[place] MAIN [facility] SERVICES [address] [place], [place], [address] Phone: [phone]   Fax:  [phone]  Physical Therapy Treatment  Patient Details  Name: Krystal Mcintosh MRN: [id number] Date of Birth: November 06, 1955 Referring Provider (PT): Sabra Heck MD   Encounter Date: 04/18/2021   PT End of Session - 04/18/21 1510     Visit Number 2    Number of Visits 10    Date for PT Re-Evaluation 06/11/21    PT Start Time 9767    PT Stop Time 1600    PT Time Calculation (min) 53 min    Activity Tolerance Patient tolerated treatment well    Behavior During Therapy [facility] for tasks assessed/performed             Past Medical History:  Diagnosis Date   Basal cell carcinoma 04/22/2006   right med lower leg above ankle   Basal cell carcinoma 06/29/2007   right chest mid parasternal   Basal cell carcinoma 01/25/2014   right medial pretibial   Dysplastic nevus 07/26/2013   right medial calf    Past Surgical History:  Procedure Laterality Date   ABDOMINAL HYSTERECTOMY     BREAST EXCISIONAL BIOPSY Left 2012   benign   SHOULDER SURGERY      There were no vitals filed for this visit.   Subjective Assessment - 04/18/21 1510     Subjective Pt did not feel sore after last session. The pain shooting down the leg is 90% better. Sometimes it is at the ankle and at the thigh when she is taking her grandson on a walk.    Pertinent History Basal cell carcinoma on multiple areas of skin, abdominal hysterectomy, scoliosis    Patient Stated Goals slow leakage down and help her back pain                [facility] PT Assessment - 04/18/21 1645       Observation/Other Assessments   Observations plantar adductus on R foot, metatarsal II-III abducted    Scoliosis no convex at thoracic      Single Leg Stance   Comments 30 sec with UE support , pain at L ankle  ( post Tx: less pain at L ankle)      AROM   Overall AROM Comments no pain with L  sideflexion compared to last session      Strength   Overall Strength Comments PF with single UE support: L 15 reps MMT 4/5 ,R13 reps MMT 3+/5  with shoes, post Tx: 20 reps L, 18 reps R,   barefeet, 5 reps on R 3/5, L 15 reps with UE support     Palpation   SI assessment  levelled pelvic girdle, no more convex curve in spine    Palpation comment tightness at B feet/ hypomobility at midfoot joints. limited DF/EV, toe abduction, tib ant/ peroneal longus/ brevis tightness B                           [facility] Adult PT Treatment/Exercise - 04/18/21 1651       Neuro Re-ed    Neuro Re-ed Details  cued for less hyperextended knees      Manual Therapy   Manual therapy comments STM/MWM at problem areas noted in assessment at feet and leg to promote DF/EV, mobility at fore/ mid hind feet  PT Long Term Goals - 04/18/21 1653       PT LONG TERM GOAL #1   Title . Pt will demo proper technique with floor to stand t/f with  grandson ( 19 lbs) and report no leakage    Time 4    Period Weeks    Status New    Target Date 04/30/21      PT LONG TERM GOAL #2   Title Pt will demo levelled pelvic girdle and less convex thoracic curve and leveleld shoulder height and increased gait speed from 1.09 m/s to > 1.3 m/s    Baseline L iliac crest / R shoulder higher    Time 6    Period Weeks    Status New    Target Date 05/14/21      PT LONG TERM GOAL #3   Title Pt will demo decreased DRA separation from 3 fingers width to < 1 fingers width to improve IAP system for continence    Time 8    Period Weeks    Status New    Target Date 05/28/21      PT LONG TERM GOAL #4   Title Pt will demo increased R      hipabduction from 3/5 to > 4/5 in order to improve pelvic girdle stability for IAP system efficiency and continence    Time 8    Period Weeks    Status New    Target Date 05/28/21      PT LONG TERM GOAL #5   Title Pt will demo iomproved FOTO  score for PFDI Urinary from 45 ptsto > 55 pts and Prolapse from 58 pts to < 53 pts ino rder to imrpoveQOL    Time 10    Period Weeks    Status New    Target Date 06/11/21      PT LONG TERM GOAL #6   Title Pt will demo decreased abdominal scar restrictions and  proper technique with deep core coordination and pelvic floor without cues to improve continence    Time 6    Period Weeks    Status New    Target Date 05/14/21                   Plan - 04/18/21 1510     Clinical Impression Statement Last session helped to improve her CLBP as she reported the pain shooting down the leg is 90% better and she showed levelled pelvic girdle and no more spinal curvatures in her spine.   Focused on her c/o ankle pain and  L hip pain.  Noted lower kinetic chain deficits : supinated feet from orthotic arch and flip flop wear, toe gripping, limited toe abduction/ DF/EV, PF weakness, and limited balance.   L thigh pain after session was less widespread and the ankle pain was less after manual Tx today. Pt was able to perform a longer SLS on L after session with less ankle pain.   Continue to complete Tx at lower kinetic chain which will help yield longer lasting effects on pelvic floor issues. Plan to add deep core training at next session and continue with lower kinetic chain HEP.  Pt continues to benefit from skilled PT   Examination-Activity Limitations Continence;Carry;Toileting;Transfers;Lift;Stand    Stability/Clinical Decision Making Evolving/Moderate complexity    Rehab Potential Good    PT Frequency 1x / week    PT Duration Other (comment)   10   PT Treatment/Interventions Stair training;Functional mobility training;Therapeutic  activities;Therapeutic exercise;Patient/family education;Taping;Joint Manipulations;Dry needling;Manual techniques;Wheelchair mobility training;Neuromuscular re-education;Gait training;Scar mobilization;Balance training;ADLs/Self Care Home Management;Moist Heat     Consulted and Agree with Plan of Care Patient             Patient will benefit from skilled therapeutic intervention in order to improve the following deficits and impairments:  Decreased activity tolerance, Decreased balance, Decreased endurance, Decreased range of motion, Decreased safety awareness, Difficulty walking, Decreased strength, Impaired flexibility, Hypomobility, Decreased mobility, Decreased coordination, Abnormal gait, Increased fascial restricitons, Impaired sensation, Improper body mechanics, Pain, Increased muscle spasms, Decreased scar mobility, Postural dysfunction  Visit Diagnosis: Sacrococcygeal disorders, not elsewhere classified  Diastasis recti  Other abnormalities of gait and mobility  Chronic left-sided low back pain with left-sided sciatica     Problem List Patient Active Problem List   Diagnosis Date Noted   Major depressive disorder, recurrent, mild ([place]) 03/14/2020   Degeneration of lumbar intervertebral disc 06/27/2019   Scoliosis deformity of spine 06/27/2019   Medicare annual wellness visit, initial 10/15/2018   Adult idiopathic generalized osteoporosis 08/11/2016   Hyperlipidemia, mixed 08/11/2016   Erosive esophagitis 05/22/2015   Cervical disc disease 08/30/2013    Jerl Mina, PT 04/18/2021, 4:53 PM  [place] [address] [place], [place], [address] Phone: [phone]   Fax:  [phone]  Name: Krystal Mcintosh MRN: [id number] Date of Birth: [birth year]

## 2021-04-18 NOTE — Patient Instructions (Signed)
° °  Feet slides :   Points of contact at sitting bones  Four points of contact of foot, Heel up, ankle not twist out Lower heel Four points of contact of foot, Slide foot back   Repeated with other foot    1x day  ___  Single heel raises 10 reps  Single leg standing 30 secs, soft bend in the knees   Calf stretches after   3 x day   __  Remove the arch support   Find sandals with good support for arch and             strap around ankle, refrain from flip flops

## 2021-05-02 ENCOUNTER — Encounter: Payer: Medicare PPO | Admitting: Physical Therapy

## 2021-05-09 ENCOUNTER — Encounter: Payer: Medicare PPO | Admitting: Physical Therapy

## 2021-05-16 ENCOUNTER — Ambulatory Visit: Payer: Medicare PPO | Attending: Internal Medicine | Admitting: Physical Therapy

## 2021-05-16 ENCOUNTER — Other Ambulatory Visit: Payer: Self-pay

## 2021-05-16 ENCOUNTER — Encounter: Payer: Medicare PPO | Admitting: Physical Therapy

## 2021-05-16 DIAGNOSIS — M5442 Lumbago with sciatica, left side: Secondary | ICD-10-CM | POA: Diagnosis present

## 2021-05-16 DIAGNOSIS — M6208 Separation of muscle (nontraumatic), other site: Secondary | ICD-10-CM | POA: Diagnosis present

## 2021-05-16 DIAGNOSIS — R2689 Other abnormalities of gait and mobility: Secondary | ICD-10-CM | POA: Diagnosis present

## 2021-05-16 DIAGNOSIS — G8929 Other chronic pain: Secondary | ICD-10-CM | POA: Insufficient documentation

## 2021-05-16 DIAGNOSIS — M533 Sacrococcygeal disorders, not elsewhere classified: Secondary | ICD-10-CM | POA: Insufficient documentation

## 2021-05-16 NOTE — Therapy (Signed)
[place] MAIN [facility] SERVICES [address] [place], [place], [address] Phone: [phone]   Fax:  [phone]  Physical Therapy Treatment  Patient Details  Name: Krystal Mcintosh MRN: [id number] Date of Birth: 1955-05-14 Referring Provider (PT): Sabra Heck MD   Encounter Date: 05/16/2021   PT End of Session - 05/16/21 1504     Visit Number 3    Number of Visits 10    Date for PT Re-Evaluation 06/11/21    PT Start Time 1500    PT Stop Time 1600    PT Time Calculation (min) 60 min    Activity Tolerance Patient tolerated treatment well    Behavior During Therapy [facility] for tasks assessed/performed             Past Medical History:  Diagnosis Date   Basal cell carcinoma 04/22/2006   right med lower leg above ankle   Basal cell carcinoma 06/29/2007   right chest mid parasternal   Basal cell carcinoma 01/25/2014   right medial pretibial   Dysplastic nevus 07/26/2013   right medial calf    Past Surgical History:  Procedure Laterality Date   ABDOMINAL HYSTERECTOMY     BREAST EXCISIONAL BIOPSY Left 2012   benign   SHOULDER SURGERY      There were no vitals filed for this visit.   Subjective Assessment - 05/16/21 1505     Subjective Pt 's L ankle does not hurt anymore. Mid thigh/ groin band pain from front to back is at 4/10 with walking.  Pt's LBP has not returned to the level where she feels she needs an injection.    Pertinent History Basal cell carcinoma on multiple areas of skin, abdominal hysterectomy, scoliosis    Patient Stated Goals slow leakage down and help her back pain                [facility] PT Assessment - 05/16/21 1756       Observation/Other Assessments   Observations tendency of posterior tilt of pelvis      Coordination   Coordination and Movement Description overuse of ab with deep core      Palpation   SI assessment  levelled pelvic girdle, no more convex curve in spine                         Pelvic Floor Special Questions - 05/16/21 1755     External Perineal Exam tightness at anterior pelvic floor mm, R pubic symphysis tightness               [facility] Adult PT Treatment/Exercise - 05/16/21 1754       Therapeutic Activites    Other Therapeutic Activities guided relaxation/ mindfulness to relax pelvic floor and minimize overuse of upper trap/ ab mm      Neuro Re-ed    Neuro Re-ed Details  cued for anterior tilt of pelvis and proper coordinaiton deep core coordinaiton and less ab overuse      Modalities   Modalities Moist Heat      Moist Heat Therapy   Number Minutes Moist Heat 5 Minutes    Moist Heat Location --   perineum with guided midnfulness. relaxation     Manual Therapy   Manual therapy comments STM/MWM at R anterior pelvic floor mm and pubic mm attachments to promote more lengthening of pelvic floor  PT Long Term Goals - 05/16/21 1817       PT LONG TERM GOAL #1   Title . Pt will demo proper technique with floor to stand t/f with  grandson ( 19 lbs) and report no leakage    Time 4    Period Weeks    Status On-going    Target Date 04/30/21      PT LONG TERM GOAL #2   Title Pt will demo levelled pelvic girdle and less convex thoracic curve and leveleld shoulder height and increased gait speed from 1.09 m/s to > 1.3 m/s    Baseline L iliac crest / R shoulder higher    Time 6    Period Weeks    Status On-going    Target Date 05/14/21      PT LONG TERM GOAL #3   Title Pt will demo decreased DRA separation from 3 fingers width to < 1 fingers width to improve IAP system for continence    Time 8    Period Weeks    Status On-going    Target Date 05/28/21      PT LONG TERM GOAL #4   Title Pt will demo increased R      hipabduction from 3/5 to > 4/5 in order to improve pelvic girdle stability for IAP system efficiency and continence    Time 8    Period Weeks    Status On-going     Target Date 05/28/21      PT LONG TERM GOAL #5   Title Pt will demo iomproved FOTO score for PFDI Urinary from 45 ptsto > 55 pts and Prolapse from 58 pts to < 53 pts ino rder to imrpoveQOL    Time 10    Period Weeks    Status On-going    Target Date 06/11/21      PT LONG TERM GOAL #6   Title Pt will demo decreased abdominal scar restrictions and  proper technique with deep core coordination and pelvic floor without cues to improve continence    Time 6    Period Weeks    Status On-going    Target Date 05/14/21                   Plan - 05/16/21 1507     Clinical Impression Statement Pt is progressing well. Pt 's L ankle does not hurt anymore. Mid thigh/ groin band pain from front to back is at 4/10 with walking compared to 7/10.  Focused on stand<> floor t/f and lifting and carrying grandson who weighs 20 lbs. Pt demo'd improved mechanics to minimize straining pelvic floor.   Decreased anterior pelvic floor mm to optimize pelvic floor lengthening. Pt required cues for less overuse of ab and optimal pelvic floor mm coordination.  Pt continues to benefit frrom skilled PT.   Examination-Activity Limitations Continence;Carry;Toileting;Transfers;Lift;Stand    Stability/Clinical Decision Making Evolving/Moderate complexity    Rehab Potential Good    PT Frequency 1x / week    PT Duration Other (comment)   10   PT Treatment/Interventions Stair training;Functional mobility training;Therapeutic activities;Therapeutic exercise;Patient/family education;Taping;Joint Manipulations;Dry needling;Manual techniques;Wheelchair mobility training;Neuromuscular re-education;Gait training;Scar mobilization;Balance training;ADLs/Self Care Home Management;Moist Heat    Consulted and Agree with Plan of Care Patient             Patient will benefit from skilled therapeutic intervention in order to improve the following deficits and impairments:  Decreased activity tolerance, Decreased balance,  Decreased endurance, Decreased range of  motion, Decreased safety awareness, Difficulty walking, Decreased strength, Impaired flexibility, Hypomobility, Decreased mobility, Decreased coordination, Abnormal gait, Increased fascial restricitons, Impaired sensation, Improper body mechanics, Pain, Increased muscle spasms, Decreased scar mobility, Postural dysfunction  Visit Diagnosis: Sacrococcygeal disorders, not elsewhere classified  Diastasis recti  Other abnormalities of gait and mobility  Chronic left-sided low back pain with left-sided sciatica     Problem List Patient Active Problem List   Diagnosis Date Noted   Major depressive disorder, recurrent, mild ([place]) 03/14/2020   Degeneration of lumbar intervertebral disc 06/27/2019   Scoliosis deformity of spine 06/27/2019   Medicare annual wellness visit, initial 10/15/2018   Adult idiopathic generalized osteoporosis 08/11/2016   Hyperlipidemia, mixed 08/11/2016   Erosive esophagitis 05/22/2015   Cervical disc disease 08/30/2013    Jerl Mina, PT 05/16/2021, 6:17 PM  [place] [address] [place], [place], [address] Phone: [phone]   Fax:  [phone]  Name: Krystal Mcintosh MRN: [id number] Date of Birth: May 09, 1955

## 2021-05-16 NOTE — Patient Instructions (Signed)
°  Avoid straining pelvic floor, abdominal muscles , spine  Use log rolling technique instead of getting out of bed with your neck or the sit-up     Log rolling into and out of bed   Log rolling into and out of bed If getting out of bed on R side, Bent knees, scoot hips/ shoulder to L  Raise R arm completely overhead, rolling onto armpit  Then lower bent knees to bed to get into complete side lying position  Then drop legs off bed, and push up onto R elbow/forearm, and use L hand to push onto the bed  ___      Transition from standing to floor :  stand to floor transfer :      _ slow     _ mini squat      _ crawl down with one hand on thigh      _downward dog  - >  shoulders down and back-  walk the dog ( knee bents to lengthe hamstrings)      Floor to stand :   downward dog   Feet are wider than hips, crawl hands back, butt is back, knees behind toes -> squat  Hands at waist , elbows back, chest lifts   __  Lifting grandson with minisquat and on exhale  ___  Minisquat: Scoot buttocks back slight, hinge like you are looking at your reflection on a pond  Knees behind toes,  Inhale to "smell flowers"  Exhale on the rise "like rocket"  Do not lock knees, have more weight across ballmounds of feet, toes relaxed   10 reps x 3 x day   ___  Deep core level 1-2 ( 2 x day ) handout)

## 2021-05-23 ENCOUNTER — Encounter: Payer: Medicare PPO | Admitting: Physical Therapy

## 2021-05-30 ENCOUNTER — Ambulatory Visit: Payer: Medicare PPO | Admitting: Dermatology

## 2021-05-30 ENCOUNTER — Encounter: Payer: Medicare PPO | Admitting: Physical Therapy

## 2021-05-31 ENCOUNTER — Ambulatory Visit: Payer: Medicare PPO | Admitting: Physical Therapy

## 2021-05-31 ENCOUNTER — Other Ambulatory Visit: Payer: Self-pay

## 2021-05-31 DIAGNOSIS — M533 Sacrococcygeal disorders, not elsewhere classified: Secondary | ICD-10-CM

## 2021-05-31 DIAGNOSIS — G8929 Other chronic pain: Secondary | ICD-10-CM

## 2021-05-31 DIAGNOSIS — M6208 Separation of muscle (nontraumatic), other site: Secondary | ICD-10-CM

## 2021-05-31 DIAGNOSIS — R2689 Other abnormalities of gait and mobility: Secondary | ICD-10-CM

## 2021-05-31 NOTE — Therapy (Signed)
[place] MAIN [facility] SERVICES [address] [place], [place], [address] Phone: [phone]   Fax:  [phone]  Physical Therapy Treatment  Patient Details  Name: Krystal Mcintosh MRN: [id number] Date of Birth: 1956/01/10 Referring Provider (PT): Sabra Heck MD   Encounter Date: 05/31/2021   PT End of Session - 05/31/21 1108     Visit Number 4    Number of Visits 10    Date for PT Re-Evaluation 06/11/21    PT Start Time 1105    PT Stop Time 1200    PT Time Calculation (min) 55 min    Activity Tolerance Patient tolerated treatment well    Behavior During Therapy [facility] for tasks assessed/performed             Past Medical History:  Diagnosis Date   Basal cell carcinoma 04/22/2006   right med lower leg above ankle   Basal cell carcinoma 06/29/2007   right chest mid parasternal   Basal cell carcinoma 01/25/2014   right medial pretibial   Dysplastic nevus 07/26/2013   right medial calf    Past Surgical History:  Procedure Laterality Date   ABDOMINAL HYSTERECTOMY     BREAST EXCISIONAL BIOPSY Left 2012   benign   SHOULDER SURGERY      There were no vitals filed for this visit.   Subjective Assessment - 05/31/21 1111     Subjective Pt noticed her midthigh pain still there but it did not stop her from walking. Pt got the sandals as recommended and they feel good.    Pertinent History Basal cell carcinoma on multiple areas of skin, abdominal hysterectomy, scoliosis    Patient Stated Goals slow leakage down and help her back pain                [facility] PT Assessment - 05/31/21 1237       Palpation   Palpation comment tightness and tenderness at R SIJ, hypomobile R SIJ in nutation, tightness at adductor/ hamstring attachments at ischial rami R , leg, instrinsic foot mm , limited in toe abduction                           [facility] Adult PT Treatment/Exercise - 05/31/21 1238       Therapeutic Activites    Other  Therapeutic Activities provided cues for standing posture when holding grandson      Neuro Re-ed    Neuro Re-ed Details  cued for nutation promoting HEP and awareness of pelvic floor mobility with deep core      Modalities   Modalities Moist Heat   unbilled     Moist Heat Therapy   Number Minutes Moist Heat 5 Minutes    Moist Heat Location --   sacrum in prone to promote nutation, pillow under belly for comfort     Manual Therapy   Manual therapy comments STM/MWM at problem areas noted in assessment to promote R SIJ mobility and lower kinetic chain mobility and promote pelvic floor lengthening                          PT Long Term Goals - 05/16/21 1817       PT LONG TERM GOAL #1   Title . Pt will demo proper technique with floor to stand t/f with  grandson ( 19 lbs) and report no leakage    Time 4  Period Weeks    Status On-going    Target Date 04/30/21      PT LONG TERM GOAL #2   Title Pt will demo levelled pelvic girdle and less convex thoracic curve and leveleld shoulder height and increased gait speed from 1.09 m/s to > 1.3 m/s    Baseline L iliac crest / R shoulder higher    Time 6    Period Weeks    Status On-going    Target Date 05/14/21      PT LONG TERM GOAL #3   Title Pt will demo decreased DRA separation from 3 fingers width to < 1 fingers width to improve IAP system for continence    Time 8    Period Weeks    Status On-going    Target Date 05/28/21      PT LONG TERM GOAL #4   Title Pt will demo increased R      hipabduction from 3/5 to > 4/5 in order to improve pelvic girdle stability for IAP system efficiency and continence    Time 8    Period Weeks    Status On-going    Target Date 05/28/21      PT LONG TERM GOAL #5   Title Pt will demo iomproved FOTO score for PFDI Urinary from 45 ptsto > 55 pts and Prolapse from 58 pts to < 53 pts ino rder to imrpoveQOL    Time 10    Period Weeks    Status On-going    Target Date 06/11/21       PT LONG TERM GOAL #6   Title Pt will demo decreased abdominal scar restrictions and  proper technique with deep core coordination and pelvic floor without cues to improve continence    Time 6    Period Weeks    Status On-going    Target Date 05/14/21                   Plan - 05/31/21 1109     Clinical Impression Statement Pt is making improvements with midthigh pain no longer preventing her from walking.  Addressed hypomobility of R SIJ and lower kinetic chain and pelvic floor with manual Tx.  Cued for nutation promoting HEP and awareness of pelvic floor mobility with deep core coordination.  Provided cues for standing posture when holding grandson to avoid weighshifting to one hip to minimize SIJ hypomobility. Pt required minor cues for deep core HEP. Pt reported no mid thigh pain after session. Pt continues to benefit from skilled PT.    Examination-Activity Limitations Continence;Carry;Toileting;Transfers;Lift;Stand    Stability/Clinical Decision Making Evolving/Moderate complexity    Rehab Potential Good    PT Frequency 1x / week    PT Duration Other (comment)   10   PT Treatment/Interventions Stair training;Functional mobility training;Therapeutic activities;Therapeutic exercise;Patient/family education;Taping;Joint Manipulations;Dry needling;Manual techniques;Wheelchair mobility training;Neuromuscular re-education;Gait training;Scar mobilization;Balance training;ADLs/Self Care Home Management;Moist Heat    Consulted and Agree with Plan of Care Patient             Patient will benefit from skilled therapeutic intervention in order to improve the following deficits and impairments:  Decreased activity tolerance, Decreased balance, Decreased endurance, Decreased range of motion, Decreased safety awareness, Difficulty walking, Decreased strength, Impaired flexibility, Hypomobility, Decreased mobility, Decreased coordination, Abnormal gait, Increased fascial restricitons,  Impaired sensation, Improper body mechanics, Pain, Increased muscle spasms, Decreased scar mobility, Postural dysfunction  Visit Diagnosis: Sacrococcygeal disorders, not elsewhere classified  Diastasis recti  Other abnormalities of  gait and mobility  Chronic left-sided low back pain with left-sided sciatica     Problem List Patient Active Problem List   Diagnosis Date Noted   Major depressive disorder, recurrent, mild ([place]) 03/14/2020   Degeneration of lumbar intervertebral disc 06/27/2019   Scoliosis deformity of spine 06/27/2019   Medicare annual wellness visit, initial 10/15/2018   Adult idiopathic generalized osteoporosis 08/11/2016   Hyperlipidemia, mixed 08/11/2016   Erosive esophagitis 05/22/2015   Cervical disc disease 08/30/2013    Jerl Mina, PT 05/31/2021, 12:42 PM  [place] MAIN [facility] SERVICES [address] [place], [place], [address] Phone: [phone]   Fax:  [phone]  Name: Krystal Mcintosh MRN: [id number] Date of Birth: [birth year]

## 2021-05-31 NOTE — Patient Instructions (Signed)
° °  childs poses rocking   Toes tucked, shoulders down and back, on forearms , hands shoulder width apart  10 reps   ___  Hold grandson to you, unknocked knees, Sometimes standing ski track stance , soft knees   And not shifting hip to him on one side,

## 2021-06-06 ENCOUNTER — Encounter: Payer: Medicare PPO | Admitting: Physical Therapy

## 2021-06-13 ENCOUNTER — Encounter: Payer: Medicare PPO | Admitting: Physical Therapy

## 2021-06-20 ENCOUNTER — Encounter: Payer: Medicare PPO | Admitting: Physical Therapy

## 2021-06-27 ENCOUNTER — Encounter: Payer: Medicare PPO | Admitting: Physical Therapy

## 2021-07-08 ENCOUNTER — Ambulatory Visit: Payer: Medicare PPO | Admitting: Dermatology

## 2021-07-18 ENCOUNTER — Ambulatory Visit: Payer: Medicare PPO | Attending: Internal Medicine | Admitting: Physical Therapy

## 2021-07-18 DIAGNOSIS — R2689 Other abnormalities of gait and mobility: Secondary | ICD-10-CM | POA: Diagnosis present

## 2021-07-18 DIAGNOSIS — M6208 Separation of muscle (nontraumatic), other site: Secondary | ICD-10-CM | POA: Insufficient documentation

## 2021-07-18 DIAGNOSIS — M5442 Lumbago with sciatica, left side: Secondary | ICD-10-CM | POA: Insufficient documentation

## 2021-07-18 DIAGNOSIS — G8929 Other chronic pain: Secondary | ICD-10-CM | POA: Diagnosis present

## 2021-07-18 DIAGNOSIS — M533 Sacrococcygeal disorders, not elsewhere classified: Secondary | ICD-10-CM | POA: Insufficient documentation

## 2021-07-18 NOTE — Therapy (Signed)
[place] ?[place] [facility] ?[place], [place], [address] ?Phone: [phone]   Fax:  [phone] ? ?Physical Therapy Treatment / Discharge Summary across 5 visits ? ?Patient Details  ?Name: Krystal Mcintosh ?MRN: [id number] ?Date of Birth: Mar 07, 1956 ?Referring Provider (PT): Sabra Heck MD ? ? ?Encounter Date: 07/18/2021 ? ? PT End of Session - 07/18/21 1013   ? ? Visit Number 5   ? Number of Visits 10   ? Date for PT Re-Evaluation 07/18/21   ? PT Start Time 1004   ? PT Stop Time 1100   ? PT Time Calculation (min) 56 min   ? Activity Tolerance Patient tolerated treatment well   ? Behavior During Therapy [facility] for tasks assessed/performed   ? ?  ?  ? ?  ? ? ?Past Medical History:  ?Diagnosis Date  ? Basal cell carcinoma 04/22/2006  ? right med lower leg above ankle  ? Basal cell carcinoma 06/29/2007  ? right chest mid parasternal  ? Basal cell carcinoma 01/25/2014  ? right medial pretibial  ? Dysplastic nevus 07/26/2013  ? right medial calf  ? ? ?Past Surgical History:  ?Procedure Laterality Date  ? ABDOMINAL HYSTERECTOMY    ? BREAST EXCISIONAL BIOPSY Left 2012  ? benign  ? SHOULDER SURGERY    ? ? ?There were no vitals filed for this visit. ? ? Subjective Assessment - 07/18/21 1011   ? ? Subjective Pt reports she has more time for her exercises when staying at her dts's house to care for her grandson all week. Pt used to go to [employer] for 3 night every week but now she stays a whole week. Pt stopped wearing pads and no leakage when picking up her grandson who weighs 22 lbs.  Pt has had 95% improvement with LBP and has not had a back injection since Nov 2022 and doe snot feel she needs one. Pt has some pain on the L hip / thigh when she walks but not bad.   ? Pertinent History Basal cell carcinoma on multiple areas of skin, abdominal hysterectomy, scoliosis   ? Patient Stated Goals slow leakage down and help her back pain   ? ?  ?  ? ?  ? ? ? ? ? [facility] PT Assessment -  07/18/21 1026   ? ?  ? Ambulation/Gait  ? Gait velocity 1.50ms   ? ?  ?  ? ?  ? ? ? ? ? ? ? ? ? ? ? ? ? Pelvic Floor Special Questions - 07/18/21 1509   ? ? External Perineal Exam noted proper coordinaiton, 5 quick contraction 3/5 MMT   ? ?  ?  ? ?  ? ? ? ? [facility] Adult PT Treatment/Exercise - 07/18/21 1026   ? ?  ? Therapeutic Activites   ? Other Therapeutic Activities reassess goals. discussed d/c   ?  ? Neuro Re-ed   ? Neuro Re-ed Details  cued for pelvic floor quick contractions for 5 reps during deep core   ? ?  ?  ? ?  ? ? ? ? ? ? ? ? ? ? ? ? ? ? ? PT Long Term Goals - 07/18/21 1014   ? ?  ? PT LONG TERM GOAL #1  ? Title . Pt will demo proper technique with floor to stand t/f with  grandson ( 19 lbs) and report no leakage   ? Time 4   ? Period Weeks   ?  Status Achieved   ? Target Date 04/30/21   ?  ? PT LONG TERM GOAL #2  ? Title Pt will demo levelled pelvic girdle and less convex thoracic curve and leveleld shoulder height and increased gait speed from 1.09 m/s to > 1.3 m/s  ( 07/18/21: 1.51 m/s)   ? Baseline L iliac crest / R shoulder higher  ( 4/13?23: levelled shoulders )   ? Time 6   ? Period Weeks   ? Status Achieved   ? Target Date 05/14/21   ?  ? PT LONG TERM GOAL #3  ? Title Pt will demo decreased DRA separation from 3 fingers width to < 1 fingers width to improve IAP system for continence   ? Time 8   ? Period Weeks   ? Status Achieved   ? Target Date 05/28/21   ?  ? PT LONG TERM GOAL #4  ? Title Pt will demo increased R      hipabduction from 3/5 to > 4/5 in order to improve pelvic girdle stability for IAP system efficiency and continence  ( 07/18/21: hip 4+/5 B )   ? Time 8   ? Period Weeks   ? Status Achieved   ? Target Date 05/28/21   ?  ? PT LONG TERM GOAL #5  ? Title Pt will demo improved FOTO score for PFDI Urinary from 45 ptsto > 55 pts and Prolapse from 58 pts to < 53 pts ino rder to imrpoveQOL  (07/18/21:  0 pts for both categories)   ? Time 10   ? Period Weeks   ? Status Achieved   ? Target  Date 06/11/21   ?  ? PT LONG TERM GOAL #6  ? Title Pt will demo decreased abdominal scar restrictions and  proper technique with deep core coordination and pelvic floor without cues to improve continence   ? Time 6   ? Period Weeks   ? Status Achieved   ? Target Date 05/14/21   ? ?  ?  ? ?  ? ? ? ? ? ? ? ? Plan - 07/18/21 1015   ? ? Clinical Impression Statement Pt has achieved 100% goals. FOTO scores have improved significantly ( see goal section). ? ?Pt stopped wearing pads and no leakage when picking up her grandson who weighs 22 lbs.  Pt has had 95% improvement with LBP and has not had a back injection since Nov 2022 and doe snot feel she needs one. Pt has some pain on the L hip / thigh when she walks but not bad.  ? ?Pt's spinal and pelvic girdle alignment has been corrected. Pt also has improve pelvic floor mobility and coordination. Pt 's DRA has resolved. Pt's abdominal scar restrictions have also been addressed. These improvements indicate a more effective IAP system.  ? ?Pt is IND with HEP and is ready for d/c.  ?  ? Examination-Activity Limitations Continence;Carry;Toileting;Transfers;Lift;Stand   ? Stability/Clinical Decision Making Evolving/Moderate complexity   ? Rehab Potential Good   ? PT Frequency 1x / week   ? PT Duration Other (comment)   10  ? PT Treatment/Interventions Stair training;Functional mobility training;Therapeutic activities;Therapeutic exercise;Patient/family education;Taping;Joint Manipulations;Dry needling;Manual techniques;Wheelchair mobility training;Neuromuscular re-education;Gait training;Scar mobilization;Balance training;ADLs/Self Care Home Management;Moist Heat   ? Consulted and Agree with Plan of Care Patient   ? ?  ?  ? ?  ? ? ?Patient will benefit from skilled therapeutic intervention in order to improve the following deficits and impairments:  Decreased activity tolerance, Decreased balance, Decreased endurance, Decreased range of motion, Decreased safety awareness,  Difficulty walking, Decreased strength, Impaired flexibility, Hypomobility, Decreased mobility, Decreased coordination, Abnormal gait, Increased fascial restricitons, Impaired sensation, Improper body mechanics, Pain, Increased muscle spasms, Decreased scar mobility, Postural dysfunction ? ?Visit Diagnosis: ?Sacrococcygeal disorders, not elsewhere classified ? ?Other abnormalities of gait and mobility ? ?Diastasis recti ? ?Chronic left-sided low back pain with left-sided sciatica ? ? ? ? ?Problem List ?Patient Active Problem List  ? Diagnosis Date Noted  ? Major depressive disorder, recurrent, mild ([place]) 03/14/2020  ? Degeneration of lumbar intervertebral disc 06/27/2019  ? Scoliosis deformity of spine 06/27/2019  ? Medicare annual wellness visit, initial 10/15/2018  ? Adult idiopathic generalized osteoporosis 08/11/2016  ? Hyperlipidemia, mixed 08/11/2016  ? Erosive esophagitis 05/22/2015  ? Cervical disc disease 08/30/2013  ? ? ?Jerl Mina, PT ?07/18/2021, 3:10 PM ? ?[place] ?[place] [facility] ?[place], [place], [address] ?Phone: [phone]   Fax:  [phone] ? ?Name: Krystal Mcintosh ?MRN: [id number] ?Date of Birth: 23-Sep-1955 ? ? ? ?

## 2021-08-05 ENCOUNTER — Ambulatory Visit: Payer: Medicare PPO | Admitting: Dermatology

## 2021-08-13 ENCOUNTER — Other Ambulatory Visit: Payer: Self-pay | Admitting: Internal Medicine

## 2021-08-13 ENCOUNTER — Ambulatory Visit
Admission: RE | Admit: 2021-08-13 | Discharge: 2021-08-13 | Disposition: A | Discharge status: Home / Self Care | Payer: Medicare PPO | Source: Ambulatory Visit | Attending: Internal Medicine | Admitting: Internal Medicine

## 2021-08-13 ENCOUNTER — Inpatient Hospital Stay: Admission: RE | Admit: 2021-08-13 | Payer: Medicare PPO | Source: Ambulatory Visit

## 2021-08-13 DIAGNOSIS — R1031 Right lower quadrant pain: Secondary | ICD-10-CM

## 2021-08-13 MED ORDER — IOHEXOL 300 MG/ML  SOLN
100.0000 mL | Freq: Once | INTRAMUSCULAR | Status: AC | PRN
  Administered 2021-08-13: 100 mL via INTRAVENOUS

## 2021-08-15 ENCOUNTER — Ambulatory Visit: Payer: Medicare PPO | Admitting: Dermatology

## 2021-08-19 ENCOUNTER — Ambulatory Visit: Payer: Medicare PPO | Admitting: Dermatology

## 2021-08-19 DIAGNOSIS — B078 Other viral warts: Secondary | ICD-10-CM | POA: Diagnosis not present

## 2021-08-19 DIAGNOSIS — L578 Other skin changes due to chronic exposure to nonionizing radiation: Secondary | ICD-10-CM

## 2021-08-19 DIAGNOSIS — Z86018 Personal history of other benign neoplasm: Secondary | ICD-10-CM

## 2021-08-19 DIAGNOSIS — L820 Inflamed seborrheic keratosis: Secondary | ICD-10-CM | POA: Diagnosis not present

## 2021-08-19 DIAGNOSIS — Z85828 Personal history of other malignant neoplasm of skin: Secondary | ICD-10-CM

## 2021-08-19 DIAGNOSIS — Z1283 Encounter for screening for malignant neoplasm of skin: Secondary | ICD-10-CM

## 2021-08-19 DIAGNOSIS — D229 Melanocytic nevi, unspecified: Secondary | ICD-10-CM

## 2021-08-19 DIAGNOSIS — L814 Other melanin hyperpigmentation: Secondary | ICD-10-CM

## 2021-08-19 DIAGNOSIS — D1800 Hemangioma unspecified site: Secondary | ICD-10-CM

## 2021-08-19 DIAGNOSIS — L821 Other seborrheic keratosis: Secondary | ICD-10-CM

## 2021-08-19 NOTE — Patient Instructions (Signed)

## 2021-08-19 NOTE — Progress Notes (Signed)
Follow-Up Visit   Subjective  Krystal Mcintosh is a 66 y.o. female who presents for the following: Annual Exam (History of BCC and dysplastic nevus - The patient presents for Total-Body Skin Exam (TBSE) for skin cancer screening and mole check.  The patient has spots, moles and lesions to be evaluated, some may be new or changing and the patient has concerns that these could be cancer./).  The following portions of the chart were reviewed this encounter and updated as appropriate:   Tobacco  Allergies  Meds  Problems  Med Hx  Surg Hx  Fam Hx     Review of Systems:  No other skin or systemic complaints except as noted in HPI or Assessment and Plan.  Objective  Well appearing patient in no apparent distress; mood and affect are within normal limits.  A full examination was performed including scalp, head, eyes, ears, nose, lips, neck, chest, axillae, abdomen, back, buttocks, bilateral upper extremities, bilateral lower extremities, hands, feet, fingers, toes, fingernails, and toenails. All findings within normal limits unless otherwise noted below.  Scalp x 11, right hand x 1, right post thigh x 1, back x 5 (18) Erythematous stuck-on, waxy papule or plaque  Right Palmar Proximal 2nd Finger Verrucous papules -- Discussed viral etiology and contagion.    Assessment & Plan   History of Basal Cell Carcinoma of the Skin - No evidence of recurrence today - Recommend regular full body skin exams - Recommend daily broad spectrum sunscreen SPF 30+ to sun-exposed areas, reapply every 2 hours as needed.  - Call if any new or changing lesions are noted between office visits  History of Dysplastic Nevi - No evidence of recurrence today - Recommend regular full body skin exams - Recommend daily broad spectrum sunscreen SPF 30+ to sun-exposed areas, reapply every 2 hours as needed.  - Call if any new or changing lesions are noted between office visits  Lentigines - Scattered tan  macules - Due to sun exposure - Benign-appearing, observe - Recommend daily broad spectrum sunscreen SPF 30+ to sun-exposed areas, reapply every 2 hours as needed. - Call for any changes  Seborrheic Keratoses - Stuck-on, waxy, tan-brown papules and/or plaques  - Benign-appearing - Discussed benign etiology and prognosis. - Observe - Call for any changes  Melanocytic Nevi - Tan-brown and/or pink-flesh-colored symmetric macules and papules - Benign appearing on exam today - Observation - Call clinic for new or changing moles - Recommend daily use of broad spectrum spf 30+ sunscreen to sun-exposed areas.   Hemangiomas - Red papules - Discussed benign nature - Observe - Call for any changes  Actinic Damage - Chronic condition, secondary to cumulative UV/sun exposure - diffuse scaly erythematous macules with underlying dyspigmentation - Recommend daily broad spectrum sunscreen SPF 30+ to sun-exposed areas, reapply every 2 hours as needed.  - Staying in the shade or wearing long sleeves, sun glasses (UVA+UVB protection) and wide brim hats (4-inch brim around the entire circumference of the hat) are also recommended for sun protection.  - Call for new or changing lesions.  Skin cancer screening performed today.  Inflamed seborrheic keratosis (18) Scalp x 11, right hand x 1, right post thigh x 1, back x 5  Destruction of lesion - Scalp x 11, right hand x 1, right post thigh x 1, back x 5 Complexity: simple   Destruction method: cryotherapy   Informed consent: discussed and consent obtained   Timeout:  patient name, date of birth, surgical site,  and procedure verified Lesion destroyed using liquid nitrogen: Yes   Region frozen until ice ball extended beyond lesion: Yes   Outcome: patient tolerated procedure well with no complications   Post-procedure details: wound care instructions given    Other viral warts Right Palmar Proximal 2nd Finger  Destruction of lesion - Right  Palmar Proximal 2nd Finger Complexity: simple   Destruction method: cryotherapy   Informed consent: discussed and consent obtained   Timeout:  patient name, date of birth, surgical site, and procedure verified Lesion destroyed using liquid nitrogen: Yes   Region frozen until ice ball extended beyond lesion: Yes   Outcome: patient tolerated procedure well with no complications   Post-procedure details: wound care instructions given    Skin cancer screening   Return in about 6 months (around 02/19/2022) for ISK follow up.  I, Ashok Cordia, CMA, am acting as scribe for [name], MD . Documentation: I have reviewed the above documentation for accuracy and completeness, and I agree with the above.  [name], MD

## 2021-08-23 ENCOUNTER — Other Ambulatory Visit: Payer: Self-pay | Admitting: Family Medicine

## 2021-08-23 DIAGNOSIS — N6311 Unspecified lump in the right breast, upper outer quadrant: Secondary | ICD-10-CM

## 2021-08-30 ENCOUNTER — Encounter: Payer: Self-pay | Admitting: Dermatology

## 2021-09-12 ENCOUNTER — Ambulatory Visit
Admission: RE | Admit: 2021-09-12 | Discharge: 2021-09-12 | Disposition: A | Discharge status: Home / Self Care | Payer: Medicare PPO | Source: Ambulatory Visit | Attending: Family Medicine | Admitting: Family Medicine

## 2021-09-12 DIAGNOSIS — N6311 Unspecified lump in the right breast, upper outer quadrant: Secondary | ICD-10-CM | POA: Diagnosis present

## 2021-11-28 ENCOUNTER — Ambulatory Visit: Payer: Medicare PPO | Admitting: Dermatology

## 2021-12-04 ENCOUNTER — Ambulatory Visit: Payer: Medicare PPO | Attending: Internal Medicine | Admitting: Physical Therapy

## 2021-12-04 DIAGNOSIS — M5431 Sciatica, right side: Secondary | ICD-10-CM | POA: Diagnosis present

## 2021-12-04 DIAGNOSIS — G8929 Other chronic pain: Secondary | ICD-10-CM | POA: Insufficient documentation

## 2021-12-04 DIAGNOSIS — M533 Sacrococcygeal disorders, not elsewhere classified: Secondary | ICD-10-CM | POA: Insufficient documentation

## 2021-12-04 DIAGNOSIS — M5442 Lumbago with sciatica, left side: Secondary | ICD-10-CM | POA: Diagnosis present

## 2021-12-04 NOTE — Patient Instructions (Signed)
   Clam Shell 45 Degrees  Lying with hips and knees bent 45, one pillow between knees and ankles. Heel together, toes apart like ballerina,  Lift knee with exhale while pressing heels together. Be sure pelvis does not roll backward. Do not arch back. Do 20 times, each leg, 2 times per day.    Complimentary stretch: Figure-4   3 breaths    Alternative if hip is tight,  Sit at 45 deg turn with R leg and knee on edge of chair/ bench  Repeat with other side  ___   Wear shoe lift in toe and heel of L shoe  Remove if it causes more pain

## 2021-12-04 NOTE — Therapy (Signed)
OUTPATIENT PHYSICAL THERAPY EVALUATION   Patient Name: Krystal Mcintosh MRN: [id number] DOB:February 17, 1956, 66 y.o., female Today's Date: 12/04/2021   PT End of Session - 12/04/21 0806     Visit Number 1    Number of Visits 10    Date for PT Re-Evaluation 02/12/22    Authorization Type [employer]    PT Start Time 0800    PT Stop Time 0845    PT Time Calculation (min) 45 min    Activity Tolerance Patient tolerated treatment well    Behavior During Therapy [facility] for tasks assessed/performed             Past Medical History:  Diagnosis Date   Basal cell carcinoma 04/22/2006   right med lower leg above ankle   Basal cell carcinoma 06/29/2007   right chest mid parasternal   Basal cell carcinoma 01/25/2014   right medial pretibial   Dysplastic nevus 07/26/2013   right medial calf   Past Surgical History:  Procedure Laterality Date   ABDOMINAL HYSTERECTOMY     BREAST EXCISIONAL BIOPSY Left 2012   benign   SHOULDER SURGERY     Patient Active Problem List   Diagnosis Date Noted   Major depressive disorder, recurrent, mild ([place]) 03/14/2020   Degeneration of lumbar intervertebral disc 06/27/2019   Scoliosis deformity of spine 06/27/2019   Medicare annual wellness visit, initial 10/15/2018   Adult idiopathic generalized osteoporosis 08/11/2016   Hyperlipidemia, mixed 08/11/2016   Erosive esophagitis 05/22/2015   Cervical disc disease 08/30/2013    PCP: Emily Filbert, MD   REFERRING PROVIDER:  Emily Filbert, MD   REFERRING DIAG: N39.3 (ICD-10-CM) - Stress incontinence (female) (female)  Rationale for Evaluation and Treatment Rehabilitation  THERAPY DIAG:  Sacrococcygeal disorders, not elsewhere classified  Chronic left-sided low back pain with left-sided sciatica  Sciatica, right side  ONSET DATE: CLBP came back 3 months ago and it worsened one month ago.   SUBJECTIVE:                                                                                                                                                                                            SUBJECTIVE STATEMENT: 1) CLBP came back 3 months ago and it worsened one month ago. Pt used to get steroid shots for a couple of years every 3-4 months. Her last shot was last Nov 2022. Pt completed Pelvic PT back in Feb 2023 and has kept up with her HEP mostly.  LBP was resolved for 4 months. Pt still is caring for her grand son who lives 3 hours away and she drives once a week and  care for him (2 months old, 25 lbs) across one week every other week in a month. Pt does pick up him and gets down to the floor to play with him.    2) SUI: it occurs when she has not done her PT exercises.  Leakage occurs when waiting too long.   PERTINENT HISTORY:  Basal cell carcinoma on multiple areas of skin, abdominal hysterectomy, scoliosis   PAIN:  Are you having pain? Yes: NPRS scale: 4/10 Pain location: Down the L lateral hip and goes across the lower back to the midline , sometimes radiating down R leg     Pain description: driving 1.5 hours , it occurs as a sharp pain, otherwise it is an ache   Aggravating factors: standing and cleaning house for 8 hours with breaks and also with driving  Relieving factors: nothing    PRECAUTIONS: None  WEIGHT BEARING RESTRICTIONS No  FALLS:  Has patient fallen in last 6 months? 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 rotation of lumbar    SI assessment  R iliac crest/ shoulder slight higher standing,  higher medial malleoli L supine.     Bed Mobility   Bed Mobility --   crunch method to roll to other side     Ambulation/Gait   Gait velocity 1.39 m/s  ( after shoe lift in L foot: 1.58 m/s)    Gait Comments hip drop on L , slight trunk lean to L due to higher R hip  (after L shoe lift in sandal: reciprocal pattern and levelled pelvis, equal stance phase)             [facility] Adult PT Treatment/Exercise - 12/04/21 9211       Therapeutic Activites    Therapeutic Activities Other Therapeutic Activities      Neuro Re-ed    Neuro Re-ed Details  cued for hip abd strengthening,               HOME EXERCISE PROGRAM: See pt instruction section    ASSESSMENT:  CLINICAL IMPRESSION:  Pt is a  66  yo  who presents with CLBP and SUI which impact QOL, ADL, and grandchild-care-giving activities.   Pt's musculoskeletal assessment revealed uneven pelvic girdle and shoulder height, leg length difference, asymmetries to gait pattern, limited spinal /pelvic mobility, hip weakness, and poor body mechanics which places strain on the abdominal/pelvic floor mm. These are deficits that indicate an ineffective intraabdominal pressure system associated with increased risk for pt's Sx.   Pt was provided education on etiology of Sx with anatomy, physiology explanation with images along with the benefits of customized pelvic PT Tx based on pt's medical conditions and musculoskeletal deficits.  Explained the physiology of deep core mm coordination and  roles of pelvic floor function in urination, defecation, sexual function, and postural control with deep core mm system.   Regional interdependent approaches will yield greater benefits in pt's POC.   Following Tx today insertion of shoe heel lift in heel and toe box, pt demo'd equal alignment of pelvic girdle,  increased spinal  mobility, and increased gait speed. Plan to reassess if shoe lift in L shoe will help maintain pelvic alignment. Plan to test and add multidifis strengthening at next session.    Pt benefit from skilled PT.    OBJECTIVE IMPAIRMENTS decreased activity tolerance, decreased coordination, decreased endurance, decreased mobility, difficulty walking, decreased ROM, decreased strength, decreased safety awareness, hypomobility, increased muscle spasms, impaired flexibility, improper body mechanics, postural dysfunction, and pain.    ACTIVITY LIMITATIONS  driving, standing, cleaning house, lifting grandchild    PARTICIPATION LIMITATIONS:  bonding with grandson, driving    [place]  affecting patient's functional outcome include pt's concern about pt's cognitive changes and also her frequent driving 3 hours one way every other week to care for grandson for a week at a time.    REHAB POTENTIAL: Good   CLINICAL DECISION MAKING: Evolving/moderate complexity   EVALUATION COMPLEXITY: Moderate    PATIENT EDUCATION:    Education details: Showed pt anatomy images. Explained muscles attachments/ connection, physiology of deep core system/ spinal- thoracic-pelvis-lower kinetic chain as they relate to pt's presentation, Sx, and past Hx. Explained what and how these areas of deficits need to be restored to balance and function    See Therapeutic activity / neuromuscular re-education section  Answered pt's questions.   Person educated: Patient Education method: Explanation, Demonstration, Tactile cues, Verbal cues, and Handouts Education comprehension: verbalized understanding, returned demonstration, verbal cues required, tactile cues required, and needs further education     PLAN: PT FREQUENCY: 1x/week   PT DURATION: 10 weeks   PLANNED INTERVENTIONS: Therapeutic exercises, Therapeutic activity, Neuromuscular re-education, Balance training, Gait training, Patient/Family education, Self Care,  Joint mobilization, Spinal mobilization, Moist heat, Taping, and Manual therapy.   PLAN FOR NEXT SESSION: See clinical impression for plan     GOALS: Goals reviewed with patient? Yes  SHORT TERM GOALS: Target date: 01/01/2022    Pt will demo IND with HEP                    Baseline: Not IND            Goal status: INITIAL   LONG TERM GOALS: Target date: 02/12/2022   1.Pt will demo proper deep core coordination without chest breathing and optimal excursion of diaphragm/pelvic floor in order to promote spinal stability and pelvic floor function  Baseline: dyscoordination Goal status: INITIAL  2.  Pt will demo > 5 pt change on FOTO  to improve QOL and function  Lumbar  baseline 58pt Goal status: INITIAL  3.  Pt will demo proper body mechanics in against gravity tasks and ADLs  work tasks, fitness  to minimize straining pelvic floor / back                  Baseline: not IND, improper form that places strain on pelvic floor                Goal status: INITIAL   4. Pt will report less LBP by 50% from 4/10 and no radiating 1.5 hours to see grandson and after cleaning and standing for 8 hours.   Baseline: 4/10,  standing and cleaning house  for 8 hours with breaks and also with driving 1.5 hours  Goal status: INITIAL   5. Pt will demo leveled shoulder/ pelvic alignment across 2 sessions  Baseline: R shoulder and pelvis higher than L  Goal status: INITIAL   6. Pt will demo proper pelvic floor contraction 3 quick, 3 reps for quick contraction and 3 sec long holds for endurance to minimize SUI  Baseline:  plan to assess at next session Goal status: INITIAL     Jerl Mina, PT 12/04/2021, 8:08 AM

## 2021-12-18 ENCOUNTER — Ambulatory Visit: Payer: Medicare PPO | Attending: Internal Medicine | Admitting: Physical Therapy

## 2021-12-18 DIAGNOSIS — M6208 Separation of muscle (nontraumatic), other site: Secondary | ICD-10-CM | POA: Diagnosis present

## 2021-12-18 DIAGNOSIS — G8929 Other chronic pain: Secondary | ICD-10-CM | POA: Diagnosis present

## 2021-12-18 DIAGNOSIS — M5431 Sciatica, right side: Secondary | ICD-10-CM | POA: Diagnosis present

## 2021-12-18 DIAGNOSIS — M533 Sacrococcygeal disorders, not elsewhere classified: Secondary | ICD-10-CM | POA: Insufficient documentation

## 2021-12-18 DIAGNOSIS — M5442 Lumbago with sciatica, left side: Secondary | ICD-10-CM | POA: Diagnosis present

## 2021-12-18 DIAGNOSIS — R2689 Other abnormalities of gait and mobility: Secondary | ICD-10-CM | POA: Insufficient documentation

## 2021-12-18 NOTE — Patient Instructions (Signed)
Higher knees with walking   Reviewed clam shells

## 2021-12-18 NOTE — Therapy (Signed)
OUTPATIENT PHYSICAL THERAPY Treatment   Patient Name: Krystal Mcintosh MRN: [id number] DOB:Feb 07, 1956, 66 y.o., female Today's Date: 12/18/2021   PT End of Session - 12/18/21 0200     Visit Number 2    Number of Visits 10    Date for PT Re-Evaluation 02/12/22    Authorization Type [employer]    PT Start Time 0935    PT Stop Time 2376    PT Time Calculation (min) 40 min    Activity Tolerance Patient tolerated treatment well    Behavior During Therapy [facility] for tasks assessed/performed             Past Medical History:  Diagnosis Date   Basal cell carcinoma 04/22/2006   right med lower leg above ankle   Basal cell carcinoma 06/29/2007   right chest mid parasternal   Basal cell carcinoma 01/25/2014   right medial pretibial   Dysplastic nevus 07/26/2013   right medial calf   Past Surgical History:  Procedure Laterality Date   ABDOMINAL HYSTERECTOMY     BREAST EXCISIONAL BIOPSY Left 2012   benign   SHOULDER SURGERY     Patient Active Problem List   Diagnosis Date Noted   Major depressive disorder, recurrent, mild ([place]) 03/14/2020   Degeneration of lumbar intervertebral disc 06/27/2019   Scoliosis deformity of spine 06/27/2019   Medicare annual wellness visit, initial 10/15/2018   Adult idiopathic generalized osteoporosis 08/11/2016   Hyperlipidemia, mixed 08/11/2016   Erosive esophagitis 05/22/2015   Cervical disc disease 08/30/2013    PCP: Emily Filbert, MD   REFERRING PROVIDER:  Emily Filbert, MD   REFERRING DIAG: N39.3 (ICD-10-CM) - Stress incontinence (female) (female)  Rationale for Evaluation and Treatment Rehabilitation  THERAPY DIAG:  Sacrococcygeal disorders, not elsewhere classified  Chronic left-sided low back pain with left-sided sciatica  Sciatica, right side  Other abnormalities of gait and mobility  ONSET DATE: CLBP came back 3 months ago and it worsened one month ago.   SUBJECTIVE:                                                                                                                                                                                            SUBJECTIVE STATEMENT: 1) CLBP  2) SUI   Subjective Assessment - 12/18/21 0937     Subjective Pt bought new shoe lifts. Pt noticed her back is some better. Pt noticed pain this morning along the sides of her legs. After walking , it is some better. Pain to lateral mid thigh. R hip had twinges. 4/10 pai. pt noticed a little pain at the inside of her R knee  when she is walking ( L is her shorter leg and even with wearing shoe lift) but every day.              PERTINENT HISTORY:  Basal cell carcinoma on multiple areas of skin, abdominal hysterectomy, scoliosis   PAIN:  Are you having pain? Yes: NPRS scale: 4/10 Pain location: Down the L lateral hip and goes across the lower back to the midline , sometimes radiating down R leg     Pain description: driving 1.5 hours , it occurs as a sharp pain, otherwise it is an ache   Aggravating factors: standing and cleaning house for 8 hours with breaks and also with driving  Relieving factors: nothing    PRECAUTIONS: None  WEIGHT BEARING RESTRICTIONS No  FALLS:  Has patient fallen in last 6 months? 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 shoulders    Palpation comment tenderness at medial and lateral L Knee , tightness along lat leg      Ambulation/Gait   Gait velocity 1.89 m/s ( last session : speed  was 1.39 m/s)    Gait Comments reciporcal gait, no hip drop compared to last session             [place] Adult PT Treatment/Exercise - 12/18/21 0940       Neuro Re-ed    Neuro Re-ed Details  cued for less heel striking, more hip flexion in gait, revieed clamshell and figure 4 stretch,      Manual Therapy   Manual therapy comments STM/MWM, superior glide at Tib fib to promote DF, ER of femur over tibial on L to minimize L knee pain , STM/MWM at R ischial tueberosity with mm attachments    Kinesiotex Facilitate Muscle      Kinesiotix   Facilitate Muscle  ER femur, DF on L               HOME EXERCISE PROGRAM: See pt instruction section    ASSESSMENT:  CLINICAL IMPRESSION:  Pt showed levelled pelvic girdle alignment which is a good carry over from last session when a shoe lift was provided to L shoe.   Regional interdependent approaches was applied today to address her L knee pain .  Manual Tx helped to ER femure over tibia and K tape was provided to maintain this alignment temporarily. Pt was cued for more hip flexion in gait and less heel striking. These deficits are related to her L shorter leg which has been addressed with shoe lift.  Post Tx:  R hip flexion, knee flexion improved slightly.    Plan to test and add multidifis strengthening at next session.  Plan to add backward stepping for balancing BLE and strengthening of hip / knee flexion.   Pt benefits from skilled PT.    OBJECTIVE IMPAIRMENTS decreased activity tolerance, decreased  coordination, decreased endurance, decreased mobility, difficulty walking, decreased ROM, decreased strength, decreased safety awareness, hypomobility, increased muscle spasms, impaired flexibility, improper body mechanics, postural dysfunction, and pain.    ACTIVITY LIMITATIONS  driving, standing, cleaning house, lifting grandchild    PARTICIPATION LIMITATIONS:  bonding with grandson, driving    [place]  affecting patient's  functional outcome include pt's concern about pt's cognitive changes and also her frequent driving 3 hours one way every other week to care for grandson for a week at a time.    REHAB POTENTIAL: Good   CLINICAL DECISION MAKING: Evolving/moderate complexity   EVALUATION COMPLEXITY: Moderate    PATIENT EDUCATION:    Education details: Showed pt anatomy images. Explained muscles attachments/ connection, physiology of deep core system/ spinal- thoracic-pelvis-lower kinetic chain as they relate to pt's presentation, Sx, and past Hx. Explained what and how these areas of deficits need to be restored to balance and function    See Therapeutic activity / neuromuscular re-education section  Answered pt's questions.   Person educated: Patient Education method: Explanation, Demonstration, Tactile cues, Verbal cues, and Handouts Education comprehension: verbalized understanding, returned demonstration, verbal cues required, tactile cues required, and needs further education     PLAN: PT FREQUENCY: 1x/week   PT DURATION: 10 weeks   PLANNED INTERVENTIONS: Therapeutic exercises, Therapeutic activity, Neuromuscular re-education, Balance training, Gait training, Patient/Family education, Self Care, Joint mobilization, Spinal mobilization, Moist heat, Taping, and Manual therapy.   PLAN FOR NEXT SESSION: See clinical impression for plan     GOALS: Goals reviewed with patient? Yes  SHORT TERM GOALS: Target date: 01/01/2022    Pt will demo IND with HEP                    Baseline: Not IND            Goal status: INITIAL   LONG TERM GOALS: Target date: 02/12/2022   1.Pt will demo proper deep core coordination without chest breathing and optimal excursion of diaphragm/pelvic floor in order to promote spinal stability and pelvic floor function  Baseline: dyscoordination Goal status: INITIAL  2.  Pt will demo > 5 pt change on FOTO  to improve QOL and function  Lumbar  baseline 58pt Goal  status: INITIAL  3.  Pt will demo proper body mechanics in against gravity tasks and ADLs  work tasks, fitness  to minimize straining pelvic floor / back                  Baseline: not IND, improper form that places strain on pelvic floor                Goal status: INITIAL   4. Pt will report less LBP by 50% from 4/10 and no radiating 1.5 hours to see grandson and after cleaning and standing for 8 hours.   Baseline: 4/10,  standing and cleaning house for 8 hours with breaks and also with driving 1.5 hours  Goal status: INITIAL   5. Pt will demo leveled shoulder/ pelvic alignment across 2 sessions  Baseline: R shoulder and pelvis higher than L  Goal status: INITIAL   6. Pt will demo proper pelvic floor contraction 3 quick, 3 reps for quick contraction and 3 sec long holds for endurance to minimize SUI  Baseline:  plan to assess at next session Goal status: INITIAL     Jerl Mina, PT 12/18/2021, 10:43 AM

## 2021-12-23 ENCOUNTER — Encounter: Payer: Self-pay | Admitting: Dermatology

## 2021-12-23 ENCOUNTER — Ambulatory Visit: Payer: Medicare PPO | Admitting: Dermatology

## 2021-12-23 DIAGNOSIS — B079 Viral wart, unspecified: Secondary | ICD-10-CM

## 2021-12-23 DIAGNOSIS — L820 Inflamed seborrheic keratosis: Secondary | ICD-10-CM | POA: Diagnosis not present

## 2021-12-23 DIAGNOSIS — L578 Other skin changes due to chronic exposure to nonionizing radiation: Secondary | ICD-10-CM

## 2021-12-23 DIAGNOSIS — M67442 Ganglion, left hand: Secondary | ICD-10-CM | POA: Diagnosis not present

## 2021-12-23 DIAGNOSIS — M67449 Ganglion, unspecified hand: Secondary | ICD-10-CM

## 2021-12-23 NOTE — Patient Instructions (Addendum)
Seborrheic Keratosis  What causes seborrheic keratoses? Seborrheic keratoses are harmless, common skin growths that first appear during adult life.  As time goes by, more growths appear.  Some people may develop a large number of them.  Seborrheic keratoses appear on both covered and uncovered body parts.  They are not caused by sunlight.  The tendency to develop seborrheic keratoses can be inherited.  They vary in color from skin-colored to gray, brown, or even black.  They can be either smooth or have a rough, warty surface.   Seborrheic keratoses are superficial and look as if they were stuck on the skin.  Under the microscope this type of keratosis looks like layers upon layers of skin.  That is why at times the top layer may seem to fall off, but the rest of the growth remains and re-grows.    Treatment Seborrheic keratoses do not need to be treated, but can easily be removed in the office.  Seborrheic keratoses often cause symptoms when they rub on clothing or jewelry.  Lesions can be in the way of shaving.  If they become inflamed, they can cause itching, soreness, or burning.  Removal of a seborrheic keratosis can be accomplished by freezing, burning, or surgery. If any spot bleeds, scabs, or grows rapidly, please return to have it checked, as these can be an indication of a skin cancer.  Cryotherapy Aftercare  Wash gently with soap and water everyday.   Apply Vaseline and Band-Aid daily until healed.    Due to recent changes in healthcare laws, you may see results of your pathology and/or laboratory studies on MyChart before the doctors have had a chance to review them. We understand that in some cases there may be results that are confusing or concerning to you. Please understand that not all results are received at the same time and often the doctors may need to interpret multiple results in order to provide you with the best plan of care or course of treatment. Therefore, we ask that you  please give us 2 business days to thoroughly review all your results before contacting the office for clarification. Should we see a critical lab result, you will be contacted sooner.   If You Need Anything After Your Visit  If you have any questions or concerns for your doctor, please call our main line at [phone] and press option 4 to reach your doctor's medical assistant. If no one answers, please leave a voicemail as directed and we will return your call as soon as possible. Messages left after 4 pm will be answered the following business day.   You may also send us a message via MyChart. We typically respond to MyChart messages within 1-2 business days.  For prescription refills, please ask your pharmacy to contact our office. Our fax number is [phone].  If you have an urgent issue when the clinic is closed that cannot wait until the next business day, you can page your doctor at the number below.    Please note that while we do our best to be available for urgent issues outside of office hours, we are not available 24/7.   If you have an urgent issue and are unable to reach us, you may choose to seek medical care at your doctor's office, retail clinic, urgent care center, or emergency room.  If you have a medical emergency, please immediately call 911 or go to the emergency department.  Pager Numbers  - Dr. Kowalski: [phone]  -   Dr. Moye: [phone]  - Dr. Stewart: [phone]  In the event of inclement weather, please call our main line at [phone] for an update on the status of any delays or closures.  Dermatology Medication Tips: Please keep the boxes that topical medications come in in order to help keep track of the instructions about where and how to use these. Pharmacies typically print the medication instructions only on the boxes and not directly on the medication tubes.   If your medication is too expensive, please contact our office at  [phone] option 4 or send us a message through MyChart.   We are unable to tell what your co-pay for medications will be in advance as this is different depending on your insurance coverage. However, we may be able to find a substitute medication at lower cost or fill out paperwork to get insurance to cover a needed medication.   If a prior authorization is required to get your medication covered by your insurance company, please allow us 1-2 business days to complete this process.  Drug prices often vary depending on where the prescription is filled and some pharmacies may offer cheaper prices.  The website www.goodrx.com contains coupons for medications through different pharmacies. The prices here do not account for what the cost may be with help from insurance (it may be cheaper with your insurance), but the website can give you the price if you did not use any insurance.  - You can print the associated coupon and take it with your prescription to the pharmacy.  - You may also stop by our office during regular business hours and pick up a GoodRx coupon card.  - If you need your prescription sent electronically to a different pharmacy, notify our office through [place] [facility] MyChart or by phone at [phone] option 4.     Si Usted Necesita Algo Despus de Su Visita  Tambin puede enviarnos un mensaje a travs de MyChart. Por lo general respondemos a los mensajes de MyChart en el transcurso de 1 a 2 das hbiles.  Para renovar recetas, por favor pida a su farmacia que se ponga en contacto con nuestra oficina. Nuestro nmero de fax es el [phone].  Si tiene un asunto urgente cuando la clnica est cerrada y que no puede esperar hasta el siguiente da hbil, puede llamar/localizar a su doctor(a) al nmero que aparece a continuacin.   Por favor, tenga en cuenta que aunque hacemos todo lo posible para estar disponibles para asuntos urgentes fuera del horario de oficina, no estamos  disponibles las 24 horas del da, los 7 das de la semana.   Si tiene un problema urgente y no puede comunicarse con nosotros, puede optar por buscar atencin mdica  en el consultorio de su doctor(a), en una clnica privada, en un centro de atencin urgente o en una sala de emergencias.  Si tiene una emergencia mdica, por favor llame inmediatamente al 911 o vaya a la sala de emergencias.  Nmeros de bper  - Dr. Kowalski: [phone]  - Dra. Moye: [phone]  - Dra. Stewart: [phone]  En caso de inclemencias del tiempo, por favor llame a nuestra lnea principal al [phone] para una actualizacin sobre el estado de cualquier retraso o cierre.  Consejos para la medicacin en dermatologa: Por favor, guarde las cajas en las que vienen los medicamentos de uso tpico para ayudarle a seguir las instrucciones sobre dnde y cmo usarlos. Las farmacias generalmente imprimen las instrucciones del medicamento slo en las cajas y   no directamente en los tubos del medicamento.   Si su medicamento es muy caro, por favor, pngase en contacto con nuestra oficina llamando al [phone] y presione la opcin 4 o envenos un mensaje a travs de MyChart.   No podemos decirle cul ser su copago por los medicamentos por adelantado ya que esto es diferente dependiendo de la cobertura de su seguro. Sin embargo, es posible que podamos encontrar un medicamento sustituto a menor costo o llenar un formulario para que el seguro cubra el medicamento que se considera necesario.   Si se requiere una autorizacin previa para que su compaa de seguros cubra su medicamento, por favor permtanos de 1 a 2 das hbiles para completar este proceso.  Los precios de los medicamentos varan con frecuencia dependiendo del lugar de dnde se surte la receta y alguna farmacias pueden ofrecer precios ms baratos.  El sitio web www.goodrx.com tiene cupones para medicamentos de diferentes farmacias. Los precios aqu no  tienen en cuenta lo que podra costar con la ayuda del seguro (puede ser ms barato con su seguro), pero el sitio web puede darle el precio si no utiliz ningn seguro.  - Puede imprimir el cupn correspondiente y llevarlo con su receta a la farmacia.  - Tambin puede pasar por nuestra oficina durante el horario de atencin regular y recoger una tarjeta de cupones de GoodRx.  - Si necesita que su receta se enve electrnicamente a una farmacia diferente, informe a nuestra oficina a travs de MyChart de [facility] o por telfono llamando al [phone] y presione la opcin 4.  

## 2021-12-23 NOTE — Progress Notes (Signed)
   Follow-Up Visit   Subjective  Krystal Mcintosh is a 66 y.o. female who presents for the following: Warts (6 month follow up , hx at right hand. Reports some new spots at left hand/) and Seborrheic Keratosis (6 month follow up, hx at back, scalp right hand, right thigh. Reports some spots at scalp and chest. ). The patient has spots, moles and lesions to be evaluated, some may be new or changing and the patient has concerns that these could be cancer.  The following portions of the chart were reviewed this encounter and updated as appropriate:  Tobacco  Allergies  Meds  Problems  Med Hx  Surg Hx  Fam Hx     Review of Systems: No other skin or systemic complaints except as noted in HPI or Assessment and Plan.  Objective  Well appearing patient in no apparent distress; mood and affect are within normal limits.  A focused examination was performed including scalp, chest, b/l hands. Relevant physical exam findings are noted in the Assessment and Plan.  chest x 1, scalp x 9 (10) Erythematous stuck-on, waxy papule or plaque  right index finger x 1, right proximal thumb x 1 (2) Verrucous papules -- Discussed viral etiology and contagion.    Assessment & Plan  Inflamed seborrheic keratosis (10) chest x 1, scalp x 9 Symptomatic, irritating, patient would like treated. Destruction of lesion - chest x 1, scalp x 9 Complexity: simple   Destruction method: cryotherapy   Informed consent: discussed and consent obtained   Timeout:  patient name, date of birth, surgical site, and procedure verified Lesion destroyed using liquid nitrogen: Yes   Region frozen until ice ball extended beyond lesion: Yes   Outcome: patient tolerated procedure well with no complications   Post-procedure details: wound care instructions given    Viral warts, unspecified type (2) right index finger x 1, right proximal thumb x 1 Discussed viral etiology and risk of spread.  Discussed multiple treatments may  be required to clear warts.  Discussed possible post-treatment dyspigmentation and risk of recurrence.  Destruction of lesion - right index finger x 1, right proximal thumb x 1 Complexity: simple   Destruction method: cryotherapy   Informed consent: discussed and consent obtained   Timeout:  patient name, date of birth, surgical site, and procedure verified Lesion destroyed using liquid nitrogen: Yes   Region frozen until ice ball extended beyond lesion: Yes   Outcome: patient tolerated procedure well with no complications   Post-procedure details: wound care instructions given    Digital mucous cyst Left Proximal Thumb Benign, observe.   Actinic Damage - chronic, secondary to cumulative UV radiation exposure/sun exposure over time - diffuse scaly erythematous macules with underlying dyspigmentation - Recommend daily broad spectrum sunscreen SPF 30+ to sun-exposed areas, reapply every 2 hours as needed.  - Recommend staying in the shade or wearing long sleeves, sun glasses (UVA+UVB protection) and wide brim hats (4-inch brim around the entire circumference of the hat). - Call for new or changing lesions.  Return in about 6 months (around 06/23/2022) for tbse.  IRuthell Rummage, CMA, am acting as scribe for [name], MD. Documentation: I have reviewed the above documentation for accuracy and completeness, and I agree with the above.  [name], MD

## 2021-12-25 ENCOUNTER — Ambulatory Visit: Payer: Medicare PPO | Admitting: Physical Therapy

## 2022-01-01 ENCOUNTER — Ambulatory Visit: Payer: Medicare PPO | Admitting: Physical Therapy

## 2022-01-01 DIAGNOSIS — M5431 Sciatica, right side: Secondary | ICD-10-CM

## 2022-01-01 DIAGNOSIS — G8929 Other chronic pain: Secondary | ICD-10-CM

## 2022-01-01 DIAGNOSIS — M533 Sacrococcygeal disorders, not elsewhere classified: Secondary | ICD-10-CM

## 2022-01-01 DIAGNOSIS — R2689 Other abnormalities of gait and mobility: Secondary | ICD-10-CM

## 2022-01-01 DIAGNOSIS — M6208 Separation of muscle (nontraumatic), other site: Secondary | ICD-10-CM

## 2022-01-01 NOTE — Patient Instructions (Signed)
      WALKING WITH RESISTANCE BLUE Band at waist connected to doorknob 50mns Stepping forward normal length steps, planting mid and forefoot down, center of mass ( navel) leans forward slightly as if you were walking uphill 3-4 steps till band feels taut ( MAKE SURE THE DOOR IS LOCKED AND WON'T OPEN)   Stepping backwards, lower heel slowly, carry trunk and hips back , leaning forward, front knee along 2-3 rd toe line    2 min  _____    PELVIC FLOOR / KEGEL EXERCISES   Pelvic floor/ Kegel exercises are used to strengthen the muscles in the base of your pelvis that are responsible for supporting your pelvic organs and preventing urine/feces leakage. Based on your therapist's recommendations, they can be performed while standing, sitting, or lying down. Imagine pelvic floor area as a diamond with pelvic landmarks: top =pubic bone, bottom tip=tailbone, sides=sitting bones (ischial tuberosities).    Make yourself aware of this muscle group by using these cues while coordinating your breath: Inhale, feel pelvic floor diamond area lower like hammock towards your feet and ribcage/belly expanding. Pause. Let the exhale naturally and feel your belly sink, abdominal muscles hugging in around you and you may notice the pelvic diamond draws upward towards your head forming a umbrella shape. Give a squeeze during the exhalation like you are stopping the flow of urine. If you are squeezing the buttock muscles, try to give 50% less effort.   Make yourself aware of this muscle group by using these cues: Blueberry imagery Telescope imagery Arcade machine plush animal suction lift  Jelly fish lift   Focus on the perineum, the center of the pelvic floor muscles to engage the whole set of muscles , lowering like a bowl on inhale as ribs expand like tree rings, on exhale, pelvic at the perineum gently lifts , low belly sinks, upper belly sinks.   Common Errors: Breath holding: If you are holding your  breath, you may be bearing down against your bladder instead of pulling it up. If you belly bulges up while you are squeezing, you are holding your breath. Be sure to breathe gently in and out while exercising. Counting out loud may help you avoid holding your breath. Accessory muscle use: You should not see or feel other muscle movement when performing pelvic floor exercises. When done properly, no one can tell that you are performing the exercises. Keep the buttocks, belly and inner thighs relaxed. Overdoing it: Your muscles can fatigue and stop working for you if you over-exercise. You may actually leak more or feel soreness at the lower abdomen or rectum.  YOUR HOME EXERCISE PROGRAM   SHORT HOLDS: Position: on back, Inhale and then exhale. Then squeeze the muscle.  (Be sure to let belly sink in with exhales and not push outward) Perform 6 repetitions before and after Deep Core Level 1-2 in the am and pm                      DECREASE DOWNWARD PRESSURE ON  YOUR PELVIC FLOOR, ABDOMINAL, LOW BACK MUSCLES       PRESERVE YOUR PELVIC HEALTH LONG-TERM   ** SQUEEZE pelvic floor BEFORE YOUR SNEEZE, COUGH, LAUGH   ** EXHALE BEFORE YOU RISE AGAINST GRAVITY (lifting, sit to stand, from squat to stand)   ** LOG ROLL OUT OF BED INSTEAD OF CRUNCH/SIT-UP

## 2022-01-01 NOTE — Therapy (Signed)
OUTPATIENT PHYSICAL THERAPY Treatment   Patient Name: Krystal Mcintosh MRN: [id number] DOB:[birth year], 66 y.o., female Today's Date: 01/01/2022   PT End of Session - 01/01/22 0942     Visit Number 3    Number of Visits 10    Date for PT Re-Evaluation 02/12/22    Authorization Type [employer]    PT Start Time 0940    PT Stop Time 1020    PT Time Calculation (min) 40 min    Activity Tolerance Patient tolerated treatment well    Behavior During Therapy [facility] for tasks assessed/performed             Past Medical History:  Diagnosis Date   Basal cell carcinoma 04/22/2006   right med lower leg above ankle   Basal cell carcinoma 06/29/2007   right chest mid parasternal   Basal cell carcinoma 01/25/2014   right medial pretibial   Dysplastic nevus 07/26/2013   right medial calf   Past Surgical History:  Procedure Laterality Date   ABDOMINAL HYSTERECTOMY     BREAST EXCISIONAL BIOPSY Left 2012   benign   SHOULDER SURGERY     Patient Active Problem List   Diagnosis Date Noted   Major depressive disorder, recurrent, 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  ONSET DATE: CLBP came back 3 months ago and it worsened one month ago.   SUBJECTIVE:                                                                                                                                                                                           SUBJECTIVE STATEMENT: 1) CLBP   - 2) SUI   - pt reports it is a little better .   PERTINENT HISTORY:  Basal cell carcinoma on multiple areas of skin, abdominal hysterectomy, scoliosis   PAIN:  Are you having pain? Yes: NPRS scale: 4/10 Pain location: Down the L lateral hip and goes across the lower back to the midline , sometimes radiating down R leg     Pain description: driving 1.5 hours , it occurs as a sharp pain, otherwise it is an ache   Aggravating factors:  standing and cleaning house for 8 hours with breaks and also with driving  Relieving factors: nothing    PRECAUTIONS: None  WEIGHT BEARING RESTRICTIONS No  FALLS:  Has patient fallen in last 6 months? 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   PLOF: Independent  PATIENT GOALS   Be get relief from back pain and doing exercises for pelvic floor    OBJECTIVE:    [facility] PT Assessment - 01/01/22 1010       Observation/Other Assessments   Observations poor balance with posterior COM in new HEP for gait training      Ambulation/Gait   Gait Comments poor SLS balance with posterior COM             [facility] Adult PT Treatment/Exercise - 01/01/22 1142       Therapeutic Activites    Therapeutic Activities Other Therapeutic Activities    Other Therapeutic Activities active listening to pt's stressors and provided stress management information, psychotherapy resources      Neuro Re-ed    Neuro Re-ed Details  cued for quick pelvic floor contractions, cuedf for balance SLS, co-activation of deep core with lower kinetic chain                 HOME EXERCISE PROGRAM: See pt instruction section    ASSESSMENT:  CLINICAL IMPRESSION:  Pt progressed to quick pelvic floor contractions but required cues for less ab straining.   Progressed pt to  balance and propioception training to co-activation of deep core and lower kinetic chain. Pt demo'd improved balance with SLS after tactile and verbal cues.   Plan to test and add multidifis strengthening at next session.    Provided active listening to pt's stressors and provided stress management information, psychotherapy resources.  Plan to progress to endurance pelvic floor contractions next session  Pt benefits from skilled PT.    OBJECTIVE IMPAIRMENTS decreased activity tolerance, decreased coordination, decreased endurance, decreased mobility, difficulty walking, decreased ROM, decreased strength, decreased safety awareness, hypomobility, increased muscle spasms, impaired flexibility, improper body mechanics, postural dysfunction, and pain.    ACTIVITY LIMITATIONS  driving, standing, cleaning house, lifting grandchild    PARTICIPATION LIMITATIONS:  bonding with grandson, driving    [place]  affecting patient's functional outcome include pt's concern about pt's cognitive changes and also her frequent driving 3 hours one way every other week to care for grandson for a week at a time.    REHAB POTENTIAL: Good   CLINICAL DECISION MAKING: Evolving/moderate complexity   EVALUATION COMPLEXITY: Moderate    PATIENT EDUCATION:    Education details: Showed pt anatomy images. Explained muscles attachments/ connection, physiology of deep core system/ spinal- thoracic-pelvis-lower kinetic chain as they relate to pt's presentation, Sx, and past Hx. Explained what and how these areas of deficits need to be restored to balance and function    See Therapeutic activity / neuromuscular re-education section  Answered pt's questions.   Person educated: Patient Education method: Explanation, Demonstration, Tactile cues, Verbal cues, and Handouts Education comprehension: verbalized understanding, returned demonstration, verbal cues required, tactile cues required, and needs further  education     PLAN: PT FREQUENCY: 1x/week   PT DURATION: 10 weeks   PLANNED INTERVENTIONS: Therapeutic exercises, Therapeutic activity, Neuromuscular re-education, Balance training, Gait training, Patient/Family  education, Self Care, Joint mobilization, Spinal mobilization, Moist heat, Taping, and Manual therapy.   PLAN FOR NEXT SESSION: See clinical impression for plan     GOALS: Goals reviewed with patient? Yes  SHORT TERM GOALS: Target date: 01/01/2022    Pt will demo IND with HEP                    Baseline: Not IND            Goal status: INITIAL   LONG TERM GOALS: Target date: 02/12/2022   1.Pt will demo proper deep core coordination without chest breathing and optimal excursion of diaphragm/pelvic floor in order to promote spinal stability and pelvic floor function  Baseline: dyscoordination Goal status: INITIAL  2.  Pt will demo > 5 pt change on FOTO  to improve QOL and function  Lumbar  baseline 58pt Goal status: INITIAL  3.  Pt will demo proper body mechanics in against gravity tasks and ADLs  work tasks, fitness  to minimize straining pelvic floor / back                  Baseline: not IND, improper form that places strain on pelvic floor                Goal status: INITIAL   4. Pt will report less LBP by 50% from 4/10 and no radiating 1.5 hours to see grandson and after cleaning and standing for 8 hours.   Baseline: 4/10,  standing and cleaning house for 8 hours with breaks and also with driving 1.5 hours  Goal status: INITIAL   5. Pt will demo leveled shoulder/ pelvic alignment across 2 sessions  Baseline: R shoulder and pelvis higher than L  Goal status: INITIAL   6. Pt will demo proper pelvic floor contraction 3 quick, 3 reps for quick contraction and 3 sec long holds for endurance to minimize SUI  Baseline:  plan to assess at next session Goal status: INITIAL     Jerl Mina, PT 01/01/2022, 11:44 AM

## 2022-01-08 ENCOUNTER — Ambulatory Visit: Payer: Medicare PPO | Admitting: Physical Therapy

## 2022-01-10 ENCOUNTER — Ambulatory Visit: Payer: Medicare PPO | Attending: Internal Medicine | Admitting: Physical Therapy

## 2022-01-10 DIAGNOSIS — M5442 Lumbago with sciatica, left side: Secondary | ICD-10-CM | POA: Diagnosis present

## 2022-01-10 DIAGNOSIS — G8929 Other chronic pain: Secondary | ICD-10-CM | POA: Insufficient documentation

## 2022-01-10 DIAGNOSIS — M5431 Sciatica, right side: Secondary | ICD-10-CM | POA: Diagnosis present

## 2022-01-10 DIAGNOSIS — M6208 Separation of muscle (nontraumatic), other site: Secondary | ICD-10-CM | POA: Diagnosis present

## 2022-01-10 DIAGNOSIS — R2689 Other abnormalities of gait and mobility: Secondary | ICD-10-CM | POA: Insufficient documentation

## 2022-01-10 DIAGNOSIS — M533 Sacrococcygeal disorders, not elsewhere classified: Secondary | ICD-10-CM | POA: Diagnosis not present

## 2022-01-10 NOTE — Patient Instructions (Signed)
   childs poses rocking   Toes tucked, shoulders down and back, on forearms , hands shoulder width apart, fingers straight, elbow back , squeeze imaginary pencils in armpit, shoulder down and away from ears   10 reps

## 2022-01-10 NOTE — Therapy (Signed)
OUTPATIENT PHYSICAL THERAPY Treatment   Patient Name: Krystal Mcintosh MRN: [id number] DOB:1955-11-30, 66 y.o., female Today's Date: 01/10/2022   PT End of Session - 01/10/22 0400     Visit Number 4    Number of Visits 10    Date for PT Re-Evaluation 02/12/22    Authorization Type [employer]    PT Start Time [phone]    PT Stop Time 1022    PT Time Calculation (min) 43 min    Activity Tolerance Patient tolerated treatment well    Behavior During Therapy [facility] for tasks assessed/performed             Past Medical History:  Diagnosis Date   Basal cell carcinoma 04/22/2006   right med lower leg above ankle   Basal cell carcinoma 06/29/2007   right chest mid parasternal   Basal cell carcinoma 01/25/2014   right medial pretibial   Dysplastic nevus 07/26/2013   right medial calf   Past Surgical History:  Procedure Laterality Date   ABDOMINAL HYSTERECTOMY     BREAST EXCISIONAL BIOPSY Left 2012   benign   SHOULDER SURGERY     Patient Active Problem List   Diagnosis Date Noted   Major depressive disorder, recurrent, mild ([place]) 03/14/2020   Degeneration of lumbar intervertebral disc 06/27/2019   Scoliosis deformity of spine 06/27/2019   Medicare annual wellness visit, initial 10/15/2018   Adult idiopathic generalized osteoporosis 08/11/2016   Hyperlipidemia, mixed 08/11/2016   Erosive esophagitis 05/22/2015   Cervical disc disease 08/30/2013    PCP: Emily Filbert, MD   REFERRING PROVIDER:  Emily Filbert, MD   REFERRING DIAG: N39.3 (ICD-10-CM) - Stress incontinence (female) (female)  Rationale for Evaluation and Treatment Rehabilitation  THERAPY DIAG:  Sacrococcygeal disorders, not elsewhere classified  Sciatica, right side  Chronic left-sided low back pain with left-sided sciatica  Other abnormalities of gait and mobility  Diastasis recti  ONSET DATE: CLBP came back 3 months ago and it worsened one month ago.   SUBJECTIVE:                                                                                                                                                                                            SUBJECTIVE STATEMENT: 1) CLBP  - jsut a little and still some down the side of her lateral midthigh after walking  2) SUI   - pt  reports it much better   PERTINENT HISTORY:  Basal cell carcinoma on multiple areas of skin, abdominal hysterectomy, scoliosis   PAIN:  Are you having pain? No   PRECAUTIONS: None  WEIGHT BEARING RESTRICTIONS No  FALLS:  Has patient fallen in last 6 months? 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   PLOF: Independent  PATIENT GOALS   Be get relief from back pain and doing exercises for pelvic floor    OBJECTIVE:    [facility] PT Assessment - 01/10/22 0948       Strength   Overall Strength Comments posterior sling R UE/ L LE with perturbation of trunk,  B hip ext R 3/5, L 4/5      Palpation   SI assessment  R SIJ hypomobility    Palpation comment hypomobile R SIJ, coccygeus, gluts              [facility] Adult PT Treatment/Exercise - 01/01/22 1142       Therapeutic Activites    Therapeutic Activities Other Therapeutic Activities    Other Therapeutic Activities active listening to pt's stressors and provided stress management information, psychotherapy resources      Neuro Re-ed    Neuro Re-ed Details  cued for quick pelvic floor contractions, cuedf for balance SLS, co-activation of deep core with lower kinetic chain                 HOME EXERCISE PROGRAM: See pt instruction section    ASSESSMENT:  CLINICAL IMPRESSION:  Pt required manual Tx to mobilize R SIJ. Pt demo'd improved nutation of sacrum after session. Plan to reassess R hip ext weakness next session. Pt showed good carry over with  last week's HEP with gait training and minimize supination. Anticipate propioception and today's SIJ mobility will help with minimize her c/o lateral radiating pain to midthigh.   Plan to progress to endurance pelvic floor contractions and add multidifis strengthening next session  Pt benefits from skilled PT.    OBJECTIVE IMPAIRMENTS decreased activity tolerance, decreased coordination, decreased endurance, decreased mobility, difficulty walking, decreased ROM, decreased strength, decreased safety awareness, hypomobility, increased muscle spasms, impaired flexibility, improper body mechanics, postural dysfunction, and pain.    ACTIVITY LIMITATIONS  driving, standing, cleaning house, lifting grandchild    PARTICIPATION LIMITATIONS:  bonding with grandson, driving    [place]  affecting patient's functional outcome include pt's concern about pt's cognitive changes and also her frequent driving 3 hours one way every other week to care for grandson for a week at a time.    REHAB POTENTIAL: Good   CLINICAL DECISION MAKING: Evolving/moderate complexity   EVALUATION COMPLEXITY: Moderate    PATIENT EDUCATION:    Education details: Showed pt anatomy images. Explained muscles attachments/ connection, physiology of deep core system/ spinal- thoracic-pelvis-lower kinetic chain as they relate to pt's presentation, Sx, and past Hx. Explained what and how these areas of deficits need to be restored to balance and function    See Therapeutic activity / neuromuscular re-education section  Answered pt's questions.   Person educated: Patient Education method: Explanation, Demonstration, Tactile cues, Verbal cues, and Handouts Education comprehension: verbalized understanding, returned demonstration, verbal cues required, tactile cues required, and needs further education     PLAN: PT FREQUENCY: 1x/week   PT DURATION: 10 weeks   PLANNED INTERVENTIONS: Therapeutic exercises, Therapeutic  activity, Neuromuscular re-education, Balance training, Gait training, Patient/Family education, Self Care, Joint mobilization, Spinal mobilization, Moist heat, Taping, and Manual therapy.   PLAN FOR NEXT SESSION: See clinical impression  for plan     GOALS: Goals reviewed with patient? Yes  SHORT TERM GOALS: Target date: 01/01/2022    Pt will demo IND with HEP                    Baseline: Not IND            Goal status: INITIAL   LONG TERM GOALS: Target date: 02/12/2022   1.Pt will demo proper deep core coordination without chest breathing and optimal excursion of diaphragm/pelvic floor in order to promote spinal stability and pelvic floor function  Baseline: dyscoordination Goal status: INITIAL  2.  Pt will demo > 5 pt change on FOTO  to improve QOL and function  Lumbar  baseline 58pt Goal status: INITIAL  3.  Pt will demo proper body mechanics in against gravity tasks and ADLs  work tasks, fitness  to minimize straining pelvic floor / back                  Baseline: not IND, improper form that places strain on pelvic floor                Goal status: INITIAL   4. Pt will report less LBP by 50% from 4/10 and no radiating 1.5 hours to see grandson and after cleaning and standing for 8 hours.   Baseline: 4/10,  standing and cleaning house for 8 hours with breaks and also with driving 1.5 hours  Goal status: INITIAL   5. Pt will demo leveled shoulder/ pelvic alignment across 2 sessions  Baseline: R shoulder and pelvis higher than L  Goal status: INITIAL   6. Pt will demo proper pelvic floor contraction 3 quick, 3 reps for quick contraction and 3 sec long holds for endurance to minimize SUI  Baseline:  plan to assess at next session Goal status: INITIAL     Jerl Mina, PT 01/10/2022, 11:59 AM

## 2022-01-15 ENCOUNTER — Ambulatory Visit: Payer: Medicare PPO | Admitting: Physical Therapy

## 2022-01-15 DIAGNOSIS — M533 Sacrococcygeal disorders, not elsewhere classified: Secondary | ICD-10-CM | POA: Diagnosis not present

## 2022-01-15 DIAGNOSIS — M5431 Sciatica, right side: Secondary | ICD-10-CM

## 2022-01-15 DIAGNOSIS — G8929 Other chronic pain: Secondary | ICD-10-CM

## 2022-01-15 DIAGNOSIS — M6208 Separation of muscle (nontraumatic), other site: Secondary | ICD-10-CM

## 2022-01-15 DIAGNOSIS — R2689 Other abnormalities of gait and mobility: Secondary | ICD-10-CM

## 2022-01-15 NOTE — Therapy (Signed)
OUTPATIENT PHYSICAL THERAPY Treatment   Patient Name: Krystal Mcintosh MRN: [id number] DOB:07/16/1955, 66 y.o., female Today's Date: 01/15/2022   PT End of Session - 01/15/22 0940     Visit Number 5    Number of Visits 10    Date for PT Re-Evaluation 02/12/22    Authorization Type [employer]    PT Start Time [phone]    PT Stop Time 1014    PT Time Calculation (min) 36 min    Activity Tolerance Patient tolerated treatment well    Behavior During Therapy [facility] for tasks assessed/performed             Past Medical History:  Diagnosis Date   Basal cell carcinoma 04/22/2006   right med lower leg above ankle   Basal cell carcinoma 06/29/2007   right chest mid parasternal   Basal cell carcinoma 01/25/2014   right medial pretibial   Dysplastic nevus 07/26/2013   right medial calf   Past Surgical History:  Procedure Laterality Date   ABDOMINAL HYSTERECTOMY     BREAST EXCISIONAL BIOPSY Left 2012   benign   SHOULDER SURGERY     Patient Active Problem List   Diagnosis Date Noted   Major depressive disorder, recurrent, mild ([place]) 03/14/2020   Degeneration of lumbar intervertebral disc 06/27/2019   Scoliosis deformity of spine 06/27/2019   Medicare annual wellness visit, initial 10/15/2018   Adult idiopathic generalized osteoporosis 08/11/2016   Hyperlipidemia, mixed 08/11/2016   Erosive esophagitis 05/22/2015   Cervical disc disease 08/30/2013    PCP: Emily Filbert, MD   REFERRING PROVIDER:  Emily Filbert, MD   REFERRING DIAG: N39.3 (ICD-10-CM) - Stress incontinence (female) (female)  Rationale for Evaluation and Treatment Rehabilitation  THERAPY DIAG:  Sacrococcygeal disorders, not elsewhere classified  Sciatica, right side  Chronic left-sided low back pain with left-sided sciatica  Other abnormalities of gait and mobility  Diastasis recti  ONSET DATE: CLBP came back 3 months ago and it worsened one month ago.   SUBJECTIVE:                                                                                                                                                                                            SUBJECTIVE STATEMENT: 1) CLBP  -   Pt states the manual Tx last session, her back has felt better  2) SUI   -   PERTINENT HISTORY:  Basal cell carcinoma on multiple areas of skin, abdominal hysterectomy, scoliosis   PAIN:  Are you having pain? No   PRECAUTIONS: None  WEIGHT BEARING RESTRICTIONS No  FALLS:  Has patient fallen in last 6  months? 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   PLOF: Independent  PATIENT GOALS   Be get relief from back pain and doing exercises for pelvic floor    OBJECTIVE:    [facility] PT Assessment - 01/15/22 1007       Observation/Other Assessments   Observations tearful as pt speaks about her husband's dementia Dx      Palpation   SI assessment  Levelled pelvic girdle        Coordination                                                                             multidifis coordination limited      [facility] Adult PT Treatment/Exercise - 01/15/22 1008       Therapeutic Activites    Other Therapeutic Activities provided active listening, compassion as pt expressed husband's dementia Dx , pt was tearful, Provided dementia resources, explained anatomy/ physiology for multifidis      Neuro Re-ed    Neuro Re-ed Details  cued for multidifis strengthening                HOME EXERCISE PROGRAM: See pt instruction section    ASSESSMENT:  CLINICAL IMPRESSION:  Pt 's LBP has improved  significantly since last session. Advanced pt to multidifis strengthening in seated position today which pt demo'd correctly following cues.   Provided resources for dementia as pt was tearful today ,explaining, her  husband's has been officially Dx with dementia. Continued to encourage pt to add psychotherapy support during this time of grief and change.   Plan to progress to endurance pelvic floor contractions next session  Pt benefits from skilled PT.    OBJECTIVE IMPAIRMENTS decreased activity tolerance, decreased coordination, decreased endurance, decreased mobility, difficulty walking, decreased ROM, decreased strength, decreased safety awareness, hypomobility, increased muscle spasms, impaired flexibility, improper body mechanics, postural dysfunction, and pain.    ACTIVITY LIMITATIONS  driving, standing, cleaning house, lifting grandchild    PARTICIPATION LIMITATIONS:  bonding with grandson, driving    [place]  affecting patient's functional outcome include pt's concern about pt's cognitive changes and also her frequent driving 3 hours one way every other week to care for grandson for a week at a time.    REHAB POTENTIAL: Good   CLINICAL DECISION MAKING: Evolving/moderate complexity   EVALUATION COMPLEXITY: Moderate    PATIENT EDUCATION:    Education details: Showed pt anatomy images. Explained muscles attachments/ connection, physiology of deep core system/ spinal- thoracic-pelvis-lower kinetic chain as they relate to pt's presentation, Sx, and past Hx. Explained what and how these areas of deficits need to be restored to balance and function    See Therapeutic activity / neuromuscular re-education section  Answered pt's questions.   Person educated: Patient Education method: Explanation, Demonstration, Tactile cues, Verbal cues, and Handouts Education comprehension: verbalized understanding, returned demonstration, verbal cues required, tactile cues required, and needs further education     PLAN: PT FREQUENCY: 1x/week   PT DURATION: 10  weeks   PLANNED INTERVENTIONS: Therapeutic exercises, Therapeutic activity, Neuromuscular re-education, Balance training, Gait training,  Patient/Family education, Self Care, Joint mobilization, Spinal mobilization, Moist heat, Taping, and Manual therapy.   PLAN FOR NEXT SESSION: See clinical impression for plan     GOALS: Goals reviewed with patient? Yes  SHORT TERM GOALS: Target date: 01/01/2022    Pt will demo IND with HEP                    Baseline: Not IND            Goal status: Met    LONG TERM GOALS: Target date: 02/12/2022   1.Pt will demo proper deep core coordination without chest breathing and optimal excursion of diaphragm/pelvic floor in order to promote spinal stability and pelvic floor function  Baseline: dyscoordination Goal status: Met   2.  Pt will demo > 5 pt change on FOTO  to improve QOL and function  Lumbar  baseline 58pt Goal status:   3.  Pt will demo proper body mechanics in against gravity tasks and ADLs  work tasks, fitness  to minimize straining pelvic floor / back                  Baseline: not IND, improper form that places strain on pelvic floor                Goal status: Met    4. Pt will report less LBP by 50% from 4/10 and no radiating 1.5 hours to see grandson and after cleaning and standing for 8 hours.   Baseline: 4/10,  standing and cleaning house for 8 hours with breaks and also with driving 1.5 hours  Goal status: Met ( 01/15/22  : 0-1/10 and no radiating back pain)    5. Pt will demo leveled shoulder/ pelvic alignment across 2 sessions  Baseline: R shoulder and pelvis higher than L  Goal status:  MET ( shoe lift in L shoe)     6. Pt will demo proper pelvic floor contraction 3 quick, 3 reps for quick contraction and 3 sec long holds for endurance to minimize SUI  Baseline:  plan to assess at next session Goal status: Partially met     Jerl Mina, PT 01/15/2022, 12:52 PM

## 2022-01-15 NOTE — Patient Instructions (Signed)
Multifidis twist   Band is on doorknob: sit facing perpendicular to door , sit halfway towards front of chair, firm through 4 points of contact at buttocks and feet. Feet are placed hip with apart.   Twisting trunk without moving the hips and knees Hold band at the level of ribcage, elbows bent,shoulder blades roll back and down like squeezing a pencil under armpit   Exhale twist,.10-15 deg away from door without moving your hips/ knees, press more weight on the side of the sitting bones/ foot opp of your direction of turn as your counterweight. Continue to maintain equal weight through legs.  Keep knee unlocked.  20 reps

## 2022-01-22 ENCOUNTER — Ambulatory Visit: Payer: Medicare PPO | Admitting: Physical Therapy

## 2022-01-24 ENCOUNTER — Ambulatory Visit: Payer: Medicare PPO | Admitting: Physical Therapy

## 2022-01-24 DIAGNOSIS — G8929 Other chronic pain: Secondary | ICD-10-CM

## 2022-01-24 DIAGNOSIS — M533 Sacrococcygeal disorders, not elsewhere classified: Secondary | ICD-10-CM

## 2022-01-24 DIAGNOSIS — M5431 Sciatica, right side: Secondary | ICD-10-CM

## 2022-01-24 DIAGNOSIS — R2689 Other abnormalities of gait and mobility: Secondary | ICD-10-CM

## 2022-01-24 DIAGNOSIS — M6208 Separation of muscle (nontraumatic), other site: Secondary | ICD-10-CM

## 2022-01-24 NOTE — Therapy (Signed)
OUTPATIENT PHYSICAL THERAPY Treatment   Patient Name: Krystal Mcintosh MRN: [id number] DOB:[birth year], 66 y.o., female Today's Date: 01/24/2022   PT End of Session - 01/24/22 0931     Visit Number 6    Number of Visits 10    Date for PT Re-Evaluation 02/12/22    Authorization Type [employer]    PT Start Time 0928    PT Stop Time 1015    PT Time Calculation (min) 47 min    Activity Tolerance Patient tolerated treatment well    Behavior During Therapy [facility] for tasks assessed/performed             Past Medical History:  Diagnosis Date   Basal cell carcinoma 04/22/2006   right med lower leg above ankle   Basal cell carcinoma 06/29/2007   right chest mid parasternal   Basal cell carcinoma 01/25/2014   right medial pretibial   Dysplastic nevus 07/26/2013   right medial calf   Past Surgical History:  Procedure Laterality Date   ABDOMINAL HYSTERECTOMY     BREAST EXCISIONAL BIOPSY Left 2012   benign   SHOULDER SURGERY     Patient Active Problem List   Diagnosis Date Noted   Major depressive disorder, recurrent, mild ([place]) 03/14/2020   Degeneration of lumbar intervertebral disc 06/27/2019   Scoliosis deformity of spine 06/27/2019   Medicare annual wellness visit, initial 10/15/2018   Adult idiopathic generalized osteoporosis 08/11/2016   Hyperlipidemia, mixed 08/11/2016   Erosive esophagitis 05/22/2015   Cervical disc disease 08/30/2013    PCP: Emily Filbert, MD   REFERRING PROVIDER:  Emily Filbert, MD   REFERRING DIAG: N39.3 (ICD-10-CM) - Stress incontinence (female) (female)  Rationale for Evaluation and Treatment Rehabilitation  THERAPY DIAG:  Sacrococcygeal disorders, not elsewhere classified  Sciatica, right side  Chronic left-sided low back pain with left-sided sciatica  Other abnormalities of gait and mobility  Diastasis recti  ONSET DATE: CLBP came back 3 months ago and it worsened one month ago.   SUBJECTIVE:                                                                                                                                                                                            SUBJECTIVE STATEMENT: 1) CLBP  -   Pt states she had pulling at her LBP after reaching and holding grandson while he had a tantrum. There is pain at the outside of the L thigh today but not yesterday. Pt is afraid she may have shifted her alignment off.    2) SUI   - Pt had a few leakage with coughing because she forget to  engage int he  muscles. Pt continues to do her HEP  PERTINENT HISTORY:  Basal cell carcinoma on multiple areas of skin, abdominal hysterectomy, scoliosis   PAIN:  Are you having pain? No   PRECAUTIONS: None  WEIGHT BEARING RESTRICTIONS No  FALLS:  Has patient fallen in last 6 months? 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   PLOF: Independent  PATIENT GOALS   Be get relief from back pain and doing exercises for pelvic floor    OBJECTIVE:      [facility] PT Assessment - 01/24/22 1014       Single Leg Stance   Comments toe clenching, supination      Palpation   Palpation comment tightness at R lateral chain, hypomobile midfoot joints intermediate/ lateral cuneiform R,  L SIJ             [facility] Adult PT Treatment/Exercise - 01/24/22 1016       Neuro Re-ed    Neuro Re-ed Details  excessive cues for less toe gripping in CKC lower kinetic chain propioception      Manual Therapy   Manual therapy comments STM/MWM at problemas noted in assessment to promote ER of RLE/foot and mobilize L SIJ      [employer]   Facilitate Muscle  EV of R foot, toe abduction                  HOME EXERCISE PROGRAM: See pt instruction section    ASSESSMENT:  CLINICAL IMPRESSION:  Pt required adjustment to pelvic girdle  after straining back from holding grandchild. Pt required manual Tx to mobility R lower kientic chain and L SIJ. Pt reported no more pain after Tx.   Initiated CKC lower kinetic chain strengthen to promote EV of R foot which is significantly adducted and to promote ER of tibia/ femur. Pt required cues for less toe gripping.   Plan to progress to endurance pelvic floor contractions next session  Pt benefits from skilled PT.    OBJECTIVE IMPAIRMENTS decreased activity tolerance, decreased coordination, decreased endurance, decreased mobility, difficulty walking, decreased ROM, decreased strength, decreased safety awareness, hypomobility, increased muscle spasms, impaired flexibility, improper body mechanics, postural dysfunction, and pain.    ACTIVITY LIMITATIONS  driving, standing, cleaning house, lifting grandchild    PARTICIPATION LIMITATIONS:  bonding with grandson, driving    [place]  affecting patient's functional outcome include pt's concern about pt's cognitive changes and also her frequent driving 3 hours one way every other week to care for grandson for a week at a time.    REHAB POTENTIAL: Good   CLINICAL DECISION MAKING: Evolving/moderate complexity   EVALUATION COMPLEXITY: Moderate    PATIENT EDUCATION:    Education details: Showed pt anatomy images. Explained muscles attachments/ connection, physiology of deep core system/ spinal- thoracic-pelvis-lower kinetic chain as they relate to pt's presentation, Sx, and past Hx. Explained what and how these areas of deficits need to be restored to balance and function    See Therapeutic activity / neuromuscular re-education section  Answered pt's questions.   Person educated: Patient Education method: Explanation, Demonstration, Tactile cues, Verbal cues, and Handouts Education comprehension: verbalized understanding, returned demonstration, verbal cues required, tactile cues required, and needs further education      PLAN: PT FREQUENCY: 1x/week  PT DURATION: 10 weeks   PLANNED INTERVENTIONS: Therapeutic exercises, Therapeutic activity, Neuromuscular re-education, Balance training, Gait training, Patient/Family education, Self Care, Joint mobilization, Spinal mobilization, Moist heat, Taping, and Manual therapy.   PLAN FOR NEXT SESSION: See clinical impression for plan     GOALS: Goals reviewed with patient? Yes  SHORT TERM GOALS: Target date: 01/01/2022    Pt will demo IND with HEP                    Baseline: Not IND            Goal status: Met    LONG TERM GOALS: Target date: 02/12/2022   1.Pt will demo proper deep core coordination without chest breathing and optimal excursion of diaphragm/pelvic floor in order to promote spinal stability and pelvic floor function  Baseline: dyscoordination Goal status: Met   2.  Pt will demo > 5 pt change on FOTO  to improve QOL and function  Lumbar  baseline 58pt Goal status:   3.  Pt will demo proper body mechanics in against gravity tasks and ADLs  work tasks, fitness  to minimize straining pelvic floor / back                  Baseline: not IND, improper form that places strain on pelvic floor                Goal status: Met    4. Pt will report less LBP by 50% from 4/10 and no radiating 1.5 hours to see grandson and after cleaning and standing for 8 hours.   Baseline: 4/10,  standing and cleaning house for 8 hours with breaks and also with driving 1.5 hours  Goal status: Met ( 01/15/22  : 0-1/10 and no radiating back pain)    5. Pt will demo leveled shoulder/ pelvic alignment across 2 sessions  Baseline: R shoulder and pelvis higher than L  Goal status:  MET ( shoe lift in L shoe)     6. Pt will demo proper pelvic floor contraction 3 quick, 3 reps for quick contraction and 3 sec long holds for endurance to minimize SUI  Baseline:  plan to assess at next session Goal status: Partially met     Jerl Mina, PT 01/24/2022, 9:37  AM

## 2022-01-24 NOTE — Patient Instructions (Signed)
Hand on counter:   Standing with L foot, toes not gripping, knee slightly bent ,  R foot and leg moving in diagonal line back , tap [place] down   20  reps  __  Strengthening feet arches:    Heel raises - heels together, minisquat  Minisquat motion, trunk bent , gaze onto floor like you are looking at your reflection over a lake/pond,  Knees bent pointed out like a "v" , navel ( center of mass) more forward  Heels together as you lift, pointed out like a "v"  KNEES ARE ALIGNED BEHIND THE TOES TO [place] your  navel ( center of mass) more forward to a avoid dropping down fast and rocking more weight back onto heels , keep heels pressing against each other the whole time   10 reps  ________________

## 2022-02-17 ENCOUNTER — Encounter: Payer: Medicare PPO | Admitting: Physical Therapy

## 2022-02-20 ENCOUNTER — Ambulatory Visit: Payer: Medicare PPO | Attending: Internal Medicine | Admitting: Physical Therapy

## 2022-02-20 DIAGNOSIS — R2689 Other abnormalities of gait and mobility: Secondary | ICD-10-CM | POA: Diagnosis present

## 2022-02-20 DIAGNOSIS — G8929 Other chronic pain: Secondary | ICD-10-CM | POA: Insufficient documentation

## 2022-02-20 DIAGNOSIS — M533 Sacrococcygeal disorders, not elsewhere classified: Secondary | ICD-10-CM | POA: Diagnosis not present

## 2022-02-20 DIAGNOSIS — M5442 Lumbago with sciatica, left side: Secondary | ICD-10-CM | POA: Diagnosis present

## 2022-02-20 DIAGNOSIS — M5431 Sciatica, right side: Secondary | ICD-10-CM | POA: Diagnosis present

## 2022-02-20 DIAGNOSIS — M6208 Separation of muscle (nontraumatic), other site: Secondary | ICD-10-CM | POA: Insufficient documentation

## 2022-02-20 NOTE — Therapy (Signed)
OUTPATIENT PHYSICAL THERAPY Treatment  / discharge summary across 7 visits   Patient Name: Krystal Mcintosh MRN: [id number] DOB:Aug 16, 1955, 66 y.o., female Today's Date: 02/20/2022   PT End of Session - 02/20/22 0700     Visit Number 7    Number of Visits 10    Date for PT Re-Evaluation 02/12/22    Authorization Type [employer]    PT Start Time 1600    Activity Tolerance Patient tolerated treatment well    Behavior During Therapy [facility] for tasks assessed/performed             Past Medical History:  Diagnosis Date   Basal cell carcinoma 04/22/2006   right med lower leg above ankle   Basal cell carcinoma 06/29/2007   right chest mid parasternal   Basal cell carcinoma 01/25/2014   right medial pretibial   Dysplastic nevus 07/26/2013   right medial calf   Past Surgical History:  Procedure Laterality Date   ABDOMINAL HYSTERECTOMY     BREAST EXCISIONAL BIOPSY Left 2012   benign   SHOULDER SURGERY     Patient Active Problem List   Diagnosis Date Noted   Major depressive disorder, recurrent, mild ([place]) 03/14/2020   Degeneration of lumbar intervertebral disc 06/27/2019   Scoliosis deformity of spine 06/27/2019   Medicare annual wellness visit, initial 10/15/2018   Adult idiopathic generalized osteoporosis 08/11/2016   Hyperlipidemia, mixed 08/11/2016   Erosive esophagitis 05/22/2015   Cervical disc disease 08/30/2013    PCP: Emily Filbert, MD   REFERRING PROVIDER:  Emily Filbert, MD   REFERRING DIAG: N39.3 (ICD-10-CM) - Stress incontinence (female) (female)  Rationale for Evaluation and Treatment Rehabilitation  THERAPY DIAG:  Sacrococcygeal disorders, not elsewhere classified  Sciatica, right side  Chronic left-sided low back pain with left-sided sciatica  Other abnormalities of gait and mobility  Diastasis recti  ONSET DATE: CLBP came back 3 months ago and it worsened one month ago.   SUBJECTIVE:                                                                                                                                                                                            SUBJECTIVE STATEMENT: 1) CLBP  -  all reoslved   2) SUI   - one episode when walking in [place]:  Basal cell carcinoma on multiple areas of skin, abdominal hysterectomy, scoliosis   PAIN:  Are you having pain? No   PRECAUTIONS: None  WEIGHT BEARING RESTRICTIONS No  FALLS:  Has patient fallen in last 6 months? 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             [place] Adult PT Treatment/Exercise - 02/20/22 1629       Therapeutic Activites    Other Therapeutic Activities administered FOTO, received goals, discussed d/c      Neuro Re-ed    Neuro Re-ed Details  cued alignment for one exercise she has question about.              HOME EXERCISE PROGRAM: See pt instruction section    ASSESSMENT:  CLINICAL IMPRESSION:  Pt achieved 100% of her goals and improved FOTO scores for LBP and pelvic issues. Pt is able to play and lift grandson with less LBP and leakage. Pt showed improved pelvic and spinal alignment along with deep core and pelvic floor strength and coordination.    Pt is ready for d/c at this time.    OBJECTIVE IMPAIRMENTS decreased activity tolerance, decreased coordination, decreased endurance, decreased mobility, difficulty  walking, decreased ROM, decreased strength, decreased safety awareness, hypomobility, increased muscle spasms, impaired flexibility, improper body mechanics, postural dysfunction, and pain.    ACTIVITY LIMITATIONS  driving, standing, cleaning house, lifting grandchild    PARTICIPATION LIMITATIONS:  bonding with grandson, driving    [place]  affecting patient's functional outcome include pt's concern about pt's cognitive changes and also her frequent driving 3 hours one way every other week to care for grandson for a week at a time.    REHAB POTENTIAL: Good   CLINICAL DECISION MAKING: Evolving/moderate complexity   EVALUATION COMPLEXITY: Moderate    PATIENT EDUCATION:    Education details: Showed pt anatomy images. Explained muscles attachments/ connection, physiology of deep core system/ spinal- thoracic-pelvis-lower kinetic chain as they relate to pt's presentation, Sx, and past Hx. Explained what and how these areas of deficits need to be restored to balance and function    See Therapeutic activity / neuromuscular re-education section  Answered pt's questions.   Person educated: Patient Education method: Explanation, Demonstration, Tactile cues, Verbal cues, and Handouts Education comprehension: verbalized understanding, returned demonstration, verbal cues required, tactile cues required, and needs further education     PLAN: PT FREQUENCY: 1x/week   PT DURATION: 10 weeks   PLANNED INTERVENTIONS: Therapeutic exercises, Therapeutic activity, Neuromuscular re-education, Balance training, Gait training, Patient/Family education, Self Care, Joint mobilization, Spinal mobilization, Moist heat, Taping, and Manual therapy.   PLAN FOR NEXT SESSION: See clinical impression for plan     GOALS: Goals reviewed with patient? Yes  SHORT TERM GOALS: Target date: 01/01/2022    Pt will demo IND with HEP                    Baseline: Not IND            Goal status: Met    LONG  TERM GOALS: Target date: 02/12/2022   1.Pt will demo  proper deep core coordination without chest breathing and optimal excursion of diaphragm/pelvic floor in order to promote spinal stability and pelvic floor function  Baseline: dyscoordination Goal status: Met   2.  Pt will demo > 5 pt change on FOTO  to improve QOL and function  Lumbar  baseline 58pt Goal status:  MET (02/20/22: 70pt)   3.  Pt will demo proper body mechanics in against gravity tasks and ADLs  work tasks, fitness  to minimize straining pelvic floor / back                  Baseline: not IND, improper form that places strain on pelvic floor                Goal status: Met    4. Pt will report less LBP by 50% from 4/10 and no radiating 1.5 hours to see grandson and after cleaning and standing for 8 hours.   Baseline: 4/10,  standing and cleaning house for 8 hours with breaks and also with driving 1.5 hours  Goal status: Met ( 01/15/22  : 0-1/10 and no radiating back pain)    5. Pt will demo leveled shoulder/ pelvic alignment across 2 sessions  Baseline: R shoulder and pelvis higher than L  Goal status:  MET ( shoe lift in L shoe)     6. Pt will demo proper pelvic floor contraction 3 quick, 3 reps for quick contraction and 3 sec long holds for endurance to minimize SUI  Baseline:   Goal status: MET      Jerl Mina, PT 02/20/2022, 4:10 PM

## 2022-06-23 ENCOUNTER — Encounter: Payer: Medicare PPO | Admitting: Dermatology

## 2022-08-05 ENCOUNTER — Other Ambulatory Visit: Payer: Self-pay | Admitting: Internal Medicine

## 2022-08-05 DIAGNOSIS — Z1231 Encounter for screening mammogram for malignant neoplasm of breast: Secondary | ICD-10-CM

## 2022-08-20 ENCOUNTER — Encounter: Payer: Self-pay | Admitting: Dermatology

## 2022-08-20 ENCOUNTER — Ambulatory Visit: Payer: Medicare PPO | Admitting: Dermatology

## 2022-08-20 VITALS — BP 128/80

## 2022-08-20 DIAGNOSIS — Z808 Family history of malignant neoplasm of other organs or systems: Secondary | ICD-10-CM

## 2022-08-20 DIAGNOSIS — D229 Melanocytic nevi, unspecified: Secondary | ICD-10-CM

## 2022-08-20 DIAGNOSIS — X32XXXA Exposure to sunlight, initial encounter: Secondary | ICD-10-CM

## 2022-08-20 DIAGNOSIS — Z1283 Encounter for screening for malignant neoplasm of skin: Secondary | ICD-10-CM

## 2022-08-20 DIAGNOSIS — Z86018 Personal history of other benign neoplasm: Secondary | ICD-10-CM

## 2022-08-20 DIAGNOSIS — W908XXA Exposure to other nonionizing radiation, initial encounter: Secondary | ICD-10-CM

## 2022-08-20 DIAGNOSIS — Z85828 Personal history of other malignant neoplasm of skin: Secondary | ICD-10-CM

## 2022-08-20 DIAGNOSIS — L814 Other melanin hyperpigmentation: Secondary | ICD-10-CM

## 2022-08-20 DIAGNOSIS — L820 Inflamed seborrheic keratosis: Secondary | ICD-10-CM

## 2022-08-20 DIAGNOSIS — D1801 Hemangioma of skin and subcutaneous tissue: Secondary | ICD-10-CM

## 2022-08-20 DIAGNOSIS — L821 Other seborrheic keratosis: Secondary | ICD-10-CM | POA: Diagnosis not present

## 2022-08-20 DIAGNOSIS — L578 Other skin changes due to chronic exposure to nonionizing radiation: Secondary | ICD-10-CM

## 2022-08-20 NOTE — Progress Notes (Signed)
Follow-Up Visit   Subjective  Krystal Mcintosh is a 67 y.o. female who presents for the following: Skin Cancer Screening and Full Body Skin Exam, hx of BCCs, hx of Dysplastic Nevus  The patient presents for Total-Body Skin Exam (TBSE) for skin cancer screening and mole check. The patient has spots, moles and lesions to be evaluated, some may be new or changing and the patient has concerns that these could be cancer.    The following portions of the chart were reviewed this encounter and updated as appropriate: medications, allergies, medical history  Review of Systems:  No other skin or systemic complaints except as noted in HPI or Assessment and Plan.  Objective  Well appearing patient in no apparent distress; mood and affect are within normal limits.  A full examination was performed including scalp, head, eyes, ears, nose, lips, neck, chest, axillae, abdomen, back, buttocks, bilateral upper extremities, bilateral lower extremities, hands, feet, fingers, toes, fingernails, and toenails. All findings within normal limits unless otherwise noted below.   Relevant physical exam findings are noted in the Assessment and Plan.  Scalp x 1 Stuck on waxy paps with erythema    Assessment & Plan   LENTIGINES, SEBORRHEIC KERATOSES, HEMANGIOMAS - Benign normal skin lesions - Benign-appearing - Call for any changes  MELANOCYTIC NEVI - Tan-brown and/or pink-flesh-colored symmetric macules and papules - Benign appearing on exam today - Observation - Call clinic for new or changing moles - Recommend daily use of broad spectrum spf 30+ sunscreen to sun-exposed areas.   ACTINIC DAMAGE - Chronic condition, secondary to cumulative UV/sun exposure - diffuse scaly erythematous macules with underlying dyspigmentation - Recommend daily broad spectrum sunscreen SPF 30+ to sun-exposed areas, reapply every 2 hours as needed.  - Staying in the shade or wearing long sleeves, sun glasses (UVA+UVB  protection) and wide brim hats (4-inch brim around the entire circumference of the hat) are also recommended for sun protection.  - Call for new or changing lesions.  SKIN CANCER SCREENING PERFORMED TODAY.  HISTORY OF BASAL CELL CARCINOMA OF THE SKIN - No evidence of recurrence today - Recommend regular full body skin exams - Recommend daily broad spectrum sunscreen SPF 30+ to sun-exposed areas, reapply every 2 hours as needed.  - Call if any new or changing lesions are noted between office visits  -R med lower leg above ankle, R chest mid parasternal, R medial pretibial, nose  HISTORY OF DYSPLASTIC NEVUS No evidence of recurrence today Recommend regular full body skin exams Recommend daily broad spectrum sunscreen SPF 30+ to sun-exposed areas, reapply every 2 hours as needed.  Call if any new or changing lesions are noted between office visits  - R medial calf  Inflamed seborrheic keratosis Scalp x 1  Symptomatic, irritating, patient would like treated.   Destruction of lesion - Scalp x 1 Complexity: simple   Destruction method: cryotherapy   Informed consent: discussed and consent obtained   Timeout:  patient name, date of birth, surgical site, and procedure verified Lesion destroyed using liquid nitrogen: Yes   Region frozen until ice ball extended beyond lesion: Yes   Outcome: patient tolerated procedure well with no complications   Post-procedure details: wound care instructions given     FAMILY HISTORY OF SKIN CANCER What type(s):Melanoma IS Who affected:Daughter    Return in about 6 months (around 02/20/2023) for ISK f/u, 6yr TBSE, Hx of BCC, Hx of Dysplastic nevi.  I, Ardis Rowan, RMA, am acting as scribe for [employer]  Gwen Pounds, MD .   Documentation: I have reviewed the above documentation for accuracy and completeness, and I agree with the above.  Armida Sans, MD

## 2022-08-20 NOTE — Patient Instructions (Addendum)
Cryotherapy Aftercare  Wash gently with soap and water everyday.   Apply Vaseline and Band-Aid daily until healed.     Due to recent changes in healthcare laws, you may see results of your pathology and/or laboratory studies on MyChart before the doctors have had a chance to review them. We understand that in some cases there may be results that are confusing or concerning to you. Please understand that not all results are received at the same time and often the doctors may need to interpret multiple results in order to provide you with the best plan of care or course of treatment. Therefore, we ask that you please give us 2 business days to thoroughly review all your results before contacting the office for clarification. Should we see a critical lab result, you will be contacted sooner.   If You Need Anything After Your Visit  If you have any questions or concerns for your doctor, please call our main line at [phone] and press option 4 to reach your doctor's medical assistant. If no one answers, please leave a voicemail as directed and we will return your call as soon as possible. Messages left after 4 pm will be answered the following business day.   You may also send us a message via MyChart. We typically respond to MyChart messages within 1-2 business days.  For prescription refills, please ask your pharmacy to contact our office. Our fax number is [phone].  If you have an urgent issue when the clinic is closed that cannot wait until the next business day, you can page your doctor at the number below.    Please note that while we do our best to be available for urgent issues outside of office hours, we are not available 24/7.   If you have an urgent issue and are unable to reach us, you may choose to seek medical care at your doctor's office, retail clinic, urgent care center, or emergency room.  If you have a medical emergency, please immediately call 911 or go to the  emergency department.  Pager Numbers  - Dr. Kowalski: [phone]  - Dr. Moye: [phone]  - Dr. Stewart: [phone]  In the event of inclement weather, please call our main line at [phone] for an update on the status of any delays or closures.  Dermatology Medication Tips: Please keep the boxes that topical medications come in in order to help keep track of the instructions about where and how to use these. Pharmacies typically print the medication instructions only on the boxes and not directly on the medication tubes.   If your medication is too expensive, please contact our office at [phone] option 4 or send us a message through MyChart.   We are unable to tell what your co-pay for medications will be in advance as this is different depending on your insurance coverage. However, we may be able to find a substitute medication at lower cost or fill out paperwork to get insurance to cover a needed medication.   If a prior authorization is required to get your medication covered by your insurance company, please allow us 1-2 business days to complete this process.  Drug prices often vary depending on where the prescription is filled and some pharmacies may offer cheaper prices.  The website www.goodrx.com contains coupons for medications through different pharmacies. The prices here do not account for what the cost may be with help from insurance (it may be cheaper with your insurance), but the website can   give you the price if you did not use any insurance.  - You can print the associated coupon and take it with your prescription to the pharmacy.  - You may also stop by our office during regular business hours and pick up a GoodRx coupon card.  - If you need your prescription sent electronically to a different pharmacy, notify our office through [facility] MyChart or by phone at [phone] option 4.     Si Usted Necesita Algo Despus de Su Visita  Tambin puede  enviarnos un mensaje a travs de MyChart. Por lo general respondemos a los mensajes de MyChart en el transcurso de 1 a 2 das hbiles.  Para renovar recetas, por favor pida a su farmacia que se ponga en contacto con nuestra oficina. Nuestro nmero de fax es el [phone].  Si tiene un asunto urgente cuando la clnica est cerrada y que no puede esperar hasta el siguiente da hbil, puede llamar/localizar a su doctor(a) al nmero que aparece a continuacin.   Por favor, tenga en cuenta que aunque hacemos todo lo posible para estar disponibles para asuntos urgentes fuera del horario de oficina, no estamos disponibles las 24 horas del da, los 7 das de la semana.   Si tiene un problema urgente y no puede comunicarse con nosotros, puede optar por buscar atencin mdica  en el consultorio de su doctor(a), en una clnica privada, en un centro de atencin urgente o en una sala de emergencias.  Si tiene una emergencia mdica, por favor llame inmediatamente al 911 o vaya a la sala de emergencias.  Nmeros de bper  - Dr. Kowalski: [phone]  - Dra. Moye: [phone]  - Dra. Stewart: [phone]  En caso de inclemencias del tiempo, por favor llame a nuestra lnea principal al [phone] para una actualizacin sobre el estado de cualquier retraso o cierre.  Consejos para la medicacin en dermatologa: Por favor, guarde las cajas en las que vienen los medicamentos de uso tpico para ayudarle a seguir las instrucciones sobre dnde y cmo usarlos. Las farmacias generalmente imprimen las instrucciones del medicamento slo en las cajas y no directamente en los tubos del medicamento.   Si su medicamento es muy caro, por favor, pngase en contacto con nuestra oficina llamando al [phone] y presione la opcin 4 o envenos un mensaje a travs de MyChart.   No podemos decirle cul ser su copago por los medicamentos por adelantado ya que esto es diferente dependiendo de la cobertura de su seguro.  Sin embargo, es posible que podamos encontrar un medicamento sustituto a menor costo o llenar un formulario para que el seguro cubra el medicamento que se considera necesario.   Si se requiere una autorizacin previa para que su compaa de seguros cubra su medicamento, por favor permtanos de 1 a 2 das hbiles para completar este proceso.  Los precios de los medicamentos varan con frecuencia dependiendo del lugar de dnde se surte la receta y alguna farmacias pueden ofrecer precios ms baratos.  El sitio web www.goodrx.com tiene cupones para medicamentos de diferentes farmacias. Los precios aqu no tienen en cuenta lo que podra costar con la ayuda del seguro (puede ser ms barato con su seguro), pero el sitio web puede darle el precio si no utiliz ningn seguro.  - Puede imprimir el cupn correspondiente y llevarlo con su receta a la farmacia.  - Tambin puede pasar por nuestra oficina durante el horario de atencin regular y recoger una tarjeta de cupones de GoodRx.  -   Si necesita que su receta se enve electrnicamente a una farmacia diferente, informe a nuestra oficina a travs de MyChart de [place] o por telfono llamando al [phone] y presione la opcin 4.  

## 2022-08-27 ENCOUNTER — Encounter: Payer: Self-pay | Admitting: Dermatology

## 2022-09-15 ENCOUNTER — Ambulatory Visit
Admission: RE | Admit: 2022-09-15 | Discharge: 2022-09-15 | Disposition: A | Discharge status: Home / Self Care | Payer: Medicare PPO | Source: Ambulatory Visit | Attending: Internal Medicine | Admitting: Internal Medicine

## 2022-09-15 DIAGNOSIS — Z1231 Encounter for screening mammogram for malignant neoplasm of breast: Secondary | ICD-10-CM | POA: Insufficient documentation

## 2022-09-19 ENCOUNTER — Other Ambulatory Visit: Payer: Self-pay | Admitting: Internal Medicine

## 2022-09-19 DIAGNOSIS — R921 Mammographic calcification found on diagnostic imaging of breast: Secondary | ICD-10-CM

## 2022-09-19 DIAGNOSIS — R928 Other abnormal and inconclusive findings on diagnostic imaging of breast: Secondary | ICD-10-CM

## 2022-09-22 ENCOUNTER — Ambulatory Visit
Admission: RE | Admit: 2022-09-22 | Discharge: 2022-09-22 | Disposition: A | Discharge status: Home / Self Care | Payer: Medicare PPO | Source: Ambulatory Visit | Attending: Internal Medicine | Admitting: Internal Medicine

## 2022-09-22 DIAGNOSIS — R921 Mammographic calcification found on diagnostic imaging of breast: Secondary | ICD-10-CM | POA: Diagnosis present

## 2022-09-22 DIAGNOSIS — R928 Other abnormal and inconclusive findings on diagnostic imaging of breast: Secondary | ICD-10-CM | POA: Insufficient documentation

## 2022-09-24 ENCOUNTER — Other Ambulatory Visit: Payer: Self-pay | Admitting: Internal Medicine

## 2022-09-24 DIAGNOSIS — R921 Mammographic calcification found on diagnostic imaging of breast: Secondary | ICD-10-CM

## 2022-09-24 DIAGNOSIS — R928 Other abnormal and inconclusive findings on diagnostic imaging of breast: Secondary | ICD-10-CM

## 2022-10-06 ENCOUNTER — Ambulatory Visit
Admission: RE | Admit: 2022-10-06 | Discharge: 2022-10-06 | Disposition: A | Discharge status: Home / Self Care | Payer: Medicare PPO | Source: Ambulatory Visit | Attending: Internal Medicine | Admitting: Internal Medicine

## 2022-10-06 DIAGNOSIS — R928 Other abnormal and inconclusive findings on diagnostic imaging of breast: Secondary | ICD-10-CM

## 2022-10-06 DIAGNOSIS — R921 Mammographic calcification found on diagnostic imaging of breast: Secondary | ICD-10-CM | POA: Insufficient documentation

## 2022-10-06 HISTORY — PX: BREAST BIOPSY: SHX20

## 2022-10-06 MED ORDER — LIDOCAINE 1 % OPTIME INJ - NO CHARGE
5.0000 mL | Freq: Once | INTRAMUSCULAR | Status: AC
  Administered 2022-10-06: 5 mL
  Filled 2022-10-06: qty 6

## 2022-10-06 MED ORDER — LIDOCAINE-EPINEPHRINE (PF) 1 %-1:200000 IJ SOLN
20.0000 mL | Freq: Once | INTRAMUSCULAR | Status: AC
  Administered 2022-10-06: 20 mL
  Filled 2022-10-06: qty 20

## 2022-10-13 ENCOUNTER — Encounter: Payer: Self-pay | Admitting: *Deleted

## 2022-10-13 NOTE — Progress Notes (Signed)
Referral recieved from [facility] for benign breast mass.  Appointment scheduled for surgical consultation with Dr. Maia Plan on July 18 at 8:15.  Appt. Details given,  No further needs at this time.

## 2022-10-23 ENCOUNTER — Ambulatory Visit: Payer: Self-pay | Admitting: General Surgery

## 2022-10-23 NOTE — H&P (View-Only) (Signed)
PATIENT PROFILE: Krystal Mcintosh is a 67 y.o. female who presents to the Clinic for consultation at the request of Dr. Hyacinth Meeker for evaluation of atypical ductal hyperplasia of the right breast.  PCP:  Carlynn Purl, MD  HISTORY OF PRESENT ILLNESS: [name] reports she had a screening mammogram on 09/15/2022.  Mammogram showed concerning calcification of the right breast.  Diagnostic mammogram of the right breast confirm indeterminate right breast calcification of about 8 mm in the upper outer quadrant.  Core biopsy of the calcification showed atypical ductal hyperplasia.  Patient denies any self palpable mass, denies any self palpable axillary adenopathy.  Patient denies nipple discharge or any skin changes.  Family history of breast cancer: Great grandmother Family history of other cancers: Brother with thymus cancer Menarche: 55-41 years old Menopause: Had hysterectomy due to chronic pain Used estrogen and progesterone therapy: Yes History of Radiation to the chest: No Number of pregnancies: 2 Age of first pregnancy: 1  PROBLEM LIST: Problem List  Date Reviewed: 05/06/2022          Noted   Acquired hypothyroidism 05/15/2022   Major depressive disorder, recurrent, mild (CMS-HCC) 03/14/2020   Overview    Situational, Zoloft, 12/21      Degeneration of lumbar intervertebral disc 06/27/2019   Scoliosis deformity of spine 06/27/2019   Medicare annual wellness visit, initial 10/15/2018   Overview    7/20, 7/21, 8/22, 8/23, 2/24      Adult idiopathic generalized osteoporosis 08/11/2016   Overview    Finished Fosamax 2006 BD 2016/2018 osteopenia      Hyperlipidemia, mixed 08/11/2016   Erosive esophagitis 05/22/2015   Overview    EGD, 11/16      Cervical disc disease 08/30/2013    GENERAL REVIEW OF SYSTEMS:   General ROS: negative for - chills, fatigue, fever, weight gain or weight loss Allergy and Immunology ROS: negative for - hives  Hematological and  Lymphatic ROS: negative for - bleeding problems or bruising, negative for palpable nodes Endocrine ROS: negative for - heat or cold intolerance, hair changes Respiratory ROS: negative for - cough, shortness of breath or wheezing Cardiovascular ROS: no chest pain or palpitations GI ROS: negative for nausea, vomiting, abdominal pain, diarrhea, constipation Musculoskeletal ROS: negative for - joint swelling or muscle pain Neurological ROS: negative for - confusion, syncope Dermatological ROS: negative for pruritus and rash Psychiatric: negative for anxiety, depression, difficulty sleeping and memory loss  MEDICATIONS: Current Outpatient Medications  Medication Sig Dispense Refill   calcium carbonate-vitamin D3 (OS-CAL 500+D) 500 mg(1,250mg ) -400 unit tablet Take 1 tablet by mouth 3 (three) times daily.       estrogens, esterified,-methylTESTOSTERone (ESTRATEST HS) 0.625-1.25 mg tablet TAKE ONE TABLET BY MOUTH ONCE DAILY 90 tablet 1   Herbal Supplement Herbal Name: Unisom/B12     levothyroxine (SYNTHROID) 50 MCG tablet Take 1 tablet (50 mcg total) by mouth once daily Take on an empty stomach with a glass of water at least 30-60 minutes before breakfast. 30 tablet 11   multivitamin tablet Take 1 tablet by mouth once daily.     nystatin (MYCOSTATIN) 100,000 unit/gram cream Apply topically 3 (three) times daily     omeprazole (PRILOSEC) 40 MG DR capsule Take 1 capsule (40 mg total) by mouth once daily 90 capsule 3   simvastatin (ZOCOR) 20 MG tablet Take 1 tablet (20 mg total) by mouth at bedtime 90 tablet 3   temazepam (RESTORIL) 15 mg capsule Take 1 capsule (15 mg total)  by mouth at bedtime as needed for Sleep 90 capsule 1   venlafaxine (EFFEXOR-XR) 75 MG XR capsule Take 1 capsule (75 mg total) by mouth once daily 90 capsule 3   No current facility-administered medications for this visit.    ALLERGIES: Patient has no known allergies.  PAST MEDICAL HISTORY: Past Medical History:  Diagnosis  Date   Bulging lumbar disc    Cervical disc disease 08/30/2013   Erosive esophagitis    GERD (gastroesophageal reflux disease)    not sure- Dr. Hyacinth Meeker started me on Nexium   Hyperlipidemia 08/30/2013   Major depressive disorder, recurrent, mild (CMS-HCC) 03/14/2020   Osteoporosis 08/30/2013   Other age-related incipient cataract, bilateral     PAST SURGICAL HISTORY: Past Surgical History:  Procedure Laterality Date   REPAIR ROTATOR CUFF TEAR ACUTE OPEN  11/2017   HERNIA REPAIR     umbilical   HYSTERECTOMY       FAMILY HISTORY: Family History  Problem Relation Name Age of Onset   Cataracts Mother June Dupree    High blood pressure (Hypertension) Mother June Dupree    Macular degeneration Mother June Dupree    Depression Mother June Dupree    Hip fracture Mother June Dupree    Osteoarthritis Mother June Dupree    Osteoporosis (Thinning of bones) Mother June Dupree    Thyroid disease Mother June Dupree    Cataracts Father Alfonso Patten    Coronary Artery Disease (Blocked arteries around heart) Father Ramon Dredge Dupree    Myocardial Infarction (Heart attack) Father Ramon Dredge Dupree    Anuerysm Father Alfonso Patten    Colon polyps Father Alfonso Patten    Glaucoma Father Alfonso Patten    Hyperlipidemia (Elevated cholesterol) Father Alfonso Patten    High blood pressure (Hypertension) Father Alfonso Patten    Skin cancer Father Alfonso Patten    Heart disease Father Alfonso Patten    Thyroid disease Sister Sharlotte Alamo    Alcohol abuse Paternal Grandfather Darcel Bayley Dupree    Asthma Other MGA    Colon cancer Neg Hx     Blindness Neg Hx     Strabismus Neg Hx       SOCIAL HISTORY: Social History   Socioeconomic History   Marital status: Married  Tobacco Use   Smoking status: Never   Smokeless tobacco: Never  Vaping Use   Vaping status: Never Used  Substance and Sexual Activity   Alcohol use: Yes    Alcohol/week: 1.0 standard drink of alcohol    Types: 1 Glasses of wine per  week    Comment: occasional   Drug use: No   Sexual activity: Not Currently    Partners: Male    Birth control/protection: Post-menopausal  Social History Narrative   Retired.    PHYSICAL EXAM: Vitals:   10/23/22 0814  BP: 127/83  Pulse: 69   Body mass index is 26.12 kg/m. Weight: 64.8 kg (142 lb 13.7 oz)   GENERAL: Alert, active, oriented x3  HEENT: Pupils equal reactive to light. Extraocular movements are intact. Sclera clear. Palpebral conjunctiva normal red color.Pharynx clear.  NECK: Supple with no palpable mass and no adenopathy.  LUNGS: Sound clear with no rales rhonchi or wheezes.  HEART: Regular rhythm S1 and S2 without murmur.  BREAST: Right breast small hematoma on the upper central portion.  Otherwise, breasts appear normal, no suspicious masses, no skin or nipple changes or axillary nodes.  ABDOMEN: Soft and depressible, nontender with no palpable mass, no hepatomegaly.  EXTREMITIES: Well-developed  well-nourished symmetrical with no dependent edema.  NEUROLOGICAL: Awake alert oriented, facial expression symmetrical, moving all extremities.  REVIEW OF DATA: I have reviewed the following data today: No visits with results within 3 Month(s) from this visit.  Latest known visit with results is:  Appointment on 05/08/2022  Component Date Value   WBC (White Blood Cell Co* 05/08/2022 5.0    RBC (Red Blood Cell Coun* 05/08/2022 4.52    Hemoglobin 05/08/2022 13.7    Hematocrit 05/08/2022 41.5    MCV (Mean Corpuscular Vo* 05/08/2022 91.8    MCH (Mean Corpuscular He* 05/08/2022 30.3    MCHC (Mean Corpuscular H* 05/08/2022 33.0    Platelet Count 05/08/2022 261    RDW-CV (Red Cell Distrib* 05/08/2022 12.2    MPV (Mean Platelet Volum* 05/08/2022 11.1    Neutrophils 05/08/2022 2.80    Lymphocytes 05/08/2022 1.62    Monocytes 05/08/2022 0.49    Eosinophils 05/08/2022 0.09    Basophils 05/08/2022 0.04    Neutrophil % 05/08/2022 55.6    Lymphocyte % 05/08/2022  32.1    Monocyte % 05/08/2022 9.7    Eosinophil % 05/08/2022 1.8    Basophil% 05/08/2022 0.8    Immature Granulocyte % 05/08/2022 0.0    Immature Granulocyte Cou* 05/08/2022 0.00    Glucose 05/08/2022 90    Sodium 05/08/2022 140    Potassium 05/08/2022 4.1    Chloride 05/08/2022 105    Carbon Dioxide (CO2) 05/08/2022 29.6    Urea Nitrogen (BUN) 05/08/2022 13    Creatinine 05/08/2022 0.7    Glomerular Filtration Ra* 05/08/2022 94    Calcium 05/08/2022 9.5    AST  05/08/2022 23    ALT  05/08/2022 18    Alk Phos (alkaline Phosp* 05/08/2022 67    Albumin 05/08/2022 4.5    Bilirubin, Total 05/08/2022 1.1    Protein, Total 05/08/2022 6.6    A/G Ratio 05/08/2022 2.1    Cholesterol, Total 05/08/2022 176    Triglyceride 05/08/2022 133    HDL (High Density Lipopr* 29/56/2130 46.6    LDL Calculated 05/08/2022 865    VLDL Cholesterol 05/08/2022 27    Cholesterol/HDL Ratio 05/08/2022 3.8    Hemoglobin A1C 05/08/2022 5.7 (H)    Average Blood Glucose (C* 05/08/2022 117    Thyroid Stimulating Horm* 05/08/2022 1.387    Color 05/08/2022 Yellow    Clarity 05/08/2022 Clear    Specific Gravity 05/08/2022 1.024    pH, Urine 05/08/2022 7.0    Protein, Urinalysis 05/08/2022 Trace (!)    Glucose, Urinalysis 05/08/2022 Negative    Ketones, Urinalysis 05/08/2022 Negative    Blood, Urinalysis 05/08/2022 Negative    Nitrite, Urinalysis 05/08/2022 Negative    Leukocyte Esterase, Urin* 05/08/2022 Negative    Bilirubin, Urinalysis 05/08/2022 Negative    Urobilinogen, Urinalysis 05/08/2022 0.2    WBC, UA 05/08/2022 2    Red Blood Cells, Urinaly* 05/08/2022 1    Bacteria, Urinalysis 05/08/2022 5-50 (!)    Squamous Epithelial Cell* 05/08/2022 2      ASSESSMENT: Ms. Harris is a 67 y.o. female presenting for consultation for right breast atypical ductal hyperplasia.    Patient was oriented again about the pathology results. Surgical alternatives were discussed with patient including radiofrequency  tag excisional biopsy. Surgical technique and post operative care was discussed with patient. Risk of surgery was discussed with patient including but not limited to: wound infection, seroma, hematoma, brachial plexopathy, mondor's disease (thrombosis of small veins of breast), chronic wound pain, breast lymphedema, altered sensation to  the nipple and cosmesis among others.   Atypical ductal hyperplasia of right breast [N60.91]  PLAN: 1.  Right breast radiofrequency tag excisional biopsy (19125) 2.  Avoid aspirin 5 days before the surgery 3.  Contact us if you have any concern  Patient and and her friend verbalized understanding, all questions were answered, and were agreeable with the plan outlined above.     Carolan Shiver, MD  Electronically signed by Carolan Shiver, MD

## 2022-10-23 NOTE — H&P (Signed)
PATIENT PROFILE: Krystal Mcintosh is a 67 y.o. female who presents to the Clinic for consultation at the request of Dr. Hyacinth Meeker for evaluation of atypical ductal hyperplasia of the right breast.  PCP:  Krystal Purl, MD  HISTORY OF PRESENT ILLNESS: Krystal Mcintosh reports she had a screening mammogram on 09/15/2022.  Mammogram showed concerning calcification of the right breast.  Diagnostic mammogram of the right breast confirm indeterminate right breast calcification of about 8 mm in the upper outer quadrant.  Core biopsy of the calcification showed atypical ductal hyperplasia.  Patient denies any self palpable mass, denies any self palpable axillary adenopathy.  Patient denies nipple discharge or any skin changes.  Family history of breast cancer: Great grandmother Family history of other cancers: Brother with thymus cancer Menarche: 55-39 years old Menopause: Had hysterectomy due to chronic pain Used estrogen and progesterone therapy: Yes History of Radiation to the chest: No Number of pregnancies: 2 Age of first pregnancy: 1  PROBLEM LIST: Problem List  Date Reviewed: 05/06/2022          Noted   Acquired hypothyroidism 05/15/2022   Major depressive disorder, recurrent, mild (CMS-HCC) 03/14/2020   Overview    Situational, Zoloft, 12/21      Degeneration of lumbar intervertebral disc 06/27/2019   Scoliosis deformity of spine 06/27/2019   Medicare annual wellness visit, initial 10/15/2018   Overview    7/20, 7/21, 8/22, 8/23, 2/24      Adult idiopathic generalized osteoporosis 08/11/2016   Overview    Finished Fosamax 2006 BD 2016/2018 osteopenia      Hyperlipidemia, mixed 08/11/2016   Erosive esophagitis 05/22/2015   Overview    EGD, 11/16      Cervical disc disease 08/30/2013    GENERAL REVIEW OF SYSTEMS:   General ROS: negative for - chills, fatigue, fever, weight gain or weight loss Allergy and Immunology ROS: negative for - hives  Hematological and  Lymphatic ROS: negative for - bleeding problems or bruising, negative for palpable nodes Endocrine ROS: negative for - heat or cold intolerance, hair changes Respiratory ROS: negative for - cough, shortness of breath or wheezing Cardiovascular ROS: no chest pain or palpitations GI ROS: negative for nausea, vomiting, abdominal pain, diarrhea, constipation Musculoskeletal ROS: negative for - joint swelling or muscle pain Neurological ROS: negative for - confusion, syncope Dermatological ROS: negative for pruritus and rash Psychiatric: negative for anxiety, depression, difficulty sleeping and memory loss  MEDICATIONS: Current Outpatient Medications  Medication Sig Dispense Refill   calcium carbonate-vitamin D3 (OS-CAL 500+D) 500 mg(1,250mg ) -400 unit tablet Take 1 tablet by mouth 3 (three) times daily.       estrogens, esterified,-methylTESTOSTERone (ESTRATEST HS) 0.625-1.25 mg tablet TAKE ONE TABLET BY MOUTH ONCE DAILY 90 tablet 1   Herbal Supplement Herbal Name: Unisom/B12     levothyroxine (SYNTHROID) 50 MCG tablet Take 1 tablet (50 mcg total) by mouth once daily Take on an empty stomach with a glass of water at least 30-60 minutes before breakfast. 30 tablet 11   multivitamin tablet Take 1 tablet by mouth once daily.     nystatin (MYCOSTATIN) 100,000 unit/gram cream Apply topically 3 (three) times daily     omeprazole (PRILOSEC) 40 MG DR capsule Take 1 capsule (40 mg total) by mouth once daily 90 capsule 3   simvastatin (ZOCOR) 20 MG tablet Take 1 tablet (20 mg total) by mouth at bedtime 90 tablet 3   temazepam (RESTORIL) 15 mg capsule Take 1 capsule (15 mg total)  by mouth at bedtime as needed for Sleep 90 capsule 1   venlafaxine (EFFEXOR-XR) 75 MG XR capsule Take 1 capsule (75 mg total) by mouth once daily 90 capsule 3   No current facility-administered medications for this visit.    ALLERGIES: Patient has no known allergies.  PAST MEDICAL HISTORY: Past Medical History:  Diagnosis  Date   Bulging lumbar disc    Cervical disc disease 08/30/2013   Erosive esophagitis    GERD (gastroesophageal reflux disease)    not sure- Dr. Hyacinth Meeker started me on Nexium   Hyperlipidemia 08/30/2013   Major depressive disorder, recurrent, mild (CMS-HCC) 03/14/2020   Osteoporosis 08/30/2013   Other age-related incipient cataract, bilateral     PAST SURGICAL HISTORY: Past Surgical History:  Procedure Laterality Date   REPAIR ROTATOR CUFF TEAR ACUTE OPEN  11/2017   HERNIA REPAIR     umbilical   HYSTERECTOMY       FAMILY HISTORY: Family History  Problem Relation Name Age of Onset   Cataracts Mother Krystal Mcintosh    High blood pressure (Hypertension) Mother Krystal Mcintosh    Macular degeneration Mother Krystal Mcintosh    Depression Mother Krystal Mcintosh    Hip fracture Mother Krystal Mcintosh    Osteoarthritis Mother Krystal Mcintosh    Osteoporosis (Thinning of bones) Mother Krystal Mcintosh    Thyroid disease Mother Krystal Mcintosh    Cataracts Father Krystal Mcintosh    Coronary Artery Disease (Blocked arteries around heart) Father Krystal Mcintosh    Myocardial Infarction (Heart attack) Father Krystal Mcintosh    Anuerysm Father Krystal Mcintosh    Colon polyps Father Krystal Mcintosh    Glaucoma Father Krystal Mcintosh    Hyperlipidemia (Elevated cholesterol) Father Krystal Mcintosh    High blood pressure (Hypertension) Father Krystal Mcintosh    Skin cancer Father Krystal Mcintosh    Heart disease Father Krystal Mcintosh    Thyroid disease Sister Krystal Mcintosh    Alcohol abuse Paternal Grandfather Krystal Mcintosh    Asthma Other MGA    Colon cancer Neg Hx     Blindness Neg Hx     Strabismus Neg Hx       SOCIAL HISTORY: Social History   Socioeconomic History   Marital status: Married  Tobacco Use   Smoking status: Never   Smokeless tobacco: Never  Vaping Use   Vaping status: Never Used  Substance and Sexual Activity   Alcohol use: Yes    Alcohol/week: 1.0 standard drink of alcohol    Types: 1 Glasses of wine per  week    Comment: occasional   Drug use: No   Sexual activity: Not Currently    Partners: Male    Birth control/protection: Post-menopausal  Social History Narrative   Retired.    PHYSICAL EXAM: Vitals:   10/23/22 0814  BP: 127/83  Pulse: 69   Body mass index is 26.12 kg/m. Weight: 64.8 kg (142 lb 13.7 oz)   GENERAL: Alert, active, oriented x3  HEENT: Pupils equal reactive to light. Extraocular movements are intact. Sclera clear. Palpebral conjunctiva normal red color.Pharynx clear.  NECK: Supple with no palpable mass and no adenopathy.  LUNGS: Sound clear with no rales rhonchi or wheezes.  HEART: Regular rhythm S1 and S2 without murmur.  BREAST: Right breast small hematoma on the upper central portion.  Otherwise, breasts appear normal, no suspicious masses, no skin or nipple changes or axillary nodes.  ABDOMEN: Soft and depressible, nontender with no palpable mass, no hepatomegaly.  EXTREMITIES: Well-developed  well-nourished symmetrical with no dependent edema.  NEUROLOGICAL: Awake alert oriented, facial expression symmetrical, moving all extremities.  REVIEW OF DATA: I have reviewed the following data today: No visits with results within 3 Month(s) from this visit.  Latest known visit with results is:  Appointment on 05/08/2022  Component Date Value   WBC (White Blood Cell Co* 05/08/2022 5.0    RBC (Red Blood Cell Coun* 05/08/2022 4.52    Hemoglobin 05/08/2022 13.7    Hematocrit 05/08/2022 41.5    MCV (Mean Corpuscular Vo* 05/08/2022 91.8    MCH (Mean Corpuscular He* 05/08/2022 30.3    MCHC (Mean Corpuscular H* 05/08/2022 33.0    Platelet Count 05/08/2022 261    RDW-CV (Red Cell Distrib* 05/08/2022 12.2    MPV (Mean Platelet Volum* 05/08/2022 11.1    Neutrophils 05/08/2022 2.80    Lymphocytes 05/08/2022 1.62    Monocytes 05/08/2022 0.49    Eosinophils 05/08/2022 0.09    Basophils 05/08/2022 0.04    Neutrophil % 05/08/2022 55.6    Lymphocyte % 05/08/2022  32.1    Monocyte % 05/08/2022 9.7    Eosinophil % 05/08/2022 1.8    Basophil% 05/08/2022 0.8    Immature Granulocyte % 05/08/2022 0.0    Immature Granulocyte Cou* 05/08/2022 0.00    Glucose 05/08/2022 90    Sodium 05/08/2022 140    Potassium 05/08/2022 4.1    Chloride 05/08/2022 105    Carbon Dioxide (CO2) 05/08/2022 29.6    Urea Nitrogen (BUN) 05/08/2022 13    Creatinine 05/08/2022 0.7    Glomerular Filtration Ra* 05/08/2022 94    Calcium 05/08/2022 9.5    AST  05/08/2022 23    ALT  05/08/2022 18    Alk Phos (alkaline Phosp* 05/08/2022 67    Albumin 05/08/2022 4.5    Bilirubin, Total 05/08/2022 1.1    Protein, Total 05/08/2022 6.6    A/G Ratio 05/08/2022 2.1    Cholesterol, Total 05/08/2022 176    Triglyceride 05/08/2022 133    HDL (High Density Lipopr* 29/56/2130 46.6    LDL Calculated 05/08/2022 865    VLDL Cholesterol 05/08/2022 27    Cholesterol/HDL Ratio 05/08/2022 3.8    Hemoglobin A1C 05/08/2022 5.7 (H)    Average Blood Glucose (C* 05/08/2022 117    Thyroid Stimulating Horm* 05/08/2022 1.387    Color 05/08/2022 Yellow    Clarity 05/08/2022 Clear    Specific Gravity 05/08/2022 1.024    pH, Urine 05/08/2022 7.0    Protein, Urinalysis 05/08/2022 Trace (!)    Glucose, Urinalysis 05/08/2022 Negative    Ketones, Urinalysis 05/08/2022 Negative    Blood, Urinalysis 05/08/2022 Negative    Nitrite, Urinalysis 05/08/2022 Negative    Leukocyte Esterase, Urin* 05/08/2022 Negative    Bilirubin, Urinalysis 05/08/2022 Negative    Urobilinogen, Urinalysis 05/08/2022 0.2    WBC, UA 05/08/2022 2    Red Blood Cells, Urinaly* 05/08/2022 1    Bacteria, Urinalysis 05/08/2022 5-50 (!)    Squamous Epithelial Cell* 05/08/2022 2      ASSESSMENT: Ms. Harris is a 67 y.o. female presenting for consultation for right breast atypical ductal hyperplasia.    Patient was oriented again about the pathology results. Surgical alternatives were discussed with patient including radiofrequency  tag excisional biopsy. Surgical technique and post operative care was discussed with patient. Risk of surgery was discussed with patient including but not limited to: wound infection, seroma, hematoma, brachial plexopathy, mondor's disease (thrombosis of small veins of breast), chronic wound pain, breast lymphedema, altered sensation to  the nipple and cosmesis among others.   Atypical ductal hyperplasia of right breast [N60.91]  PLAN: 1.  Right breast radiofrequency tag excisional biopsy (19125) 2.  Avoid aspirin 5 days before the surgery 3.  Contact us if you have any concern  Patient and and her friend verbalized understanding, all questions were answered, and were agreeable with the plan outlined above.     Carolan Shiver, MD  Electronically signed by Carolan Shiver, MD

## 2022-10-24 ENCOUNTER — Other Ambulatory Visit: Payer: Self-pay | Admitting: General Surgery

## 2022-10-24 DIAGNOSIS — N6091 Unspecified benign mammary dysplasia of right breast: Secondary | ICD-10-CM

## 2022-10-27 ENCOUNTER — Encounter
Admission: RE | Admit: 2022-10-27 | Discharge: 2022-10-27 | Disposition: A | Discharge status: Home / Self Care | Payer: Medicare PPO | Source: Ambulatory Visit | Attending: General Surgery | Admitting: General Surgery

## 2022-10-27 ENCOUNTER — Other Ambulatory Visit: Payer: Self-pay

## 2022-10-27 ENCOUNTER — Ambulatory Visit
Admission: RE | Admit: 2022-10-27 | Discharge: 2022-10-27 | Disposition: A | Discharge status: Home / Self Care | Payer: Medicare PPO | Source: Ambulatory Visit | Attending: General Surgery | Admitting: General Surgery

## 2022-10-27 VITALS — Ht 62.0 in | Wt 145.0 lb

## 2022-10-27 DIAGNOSIS — E782 Mixed hyperlipidemia: Secondary | ICD-10-CM

## 2022-10-27 DIAGNOSIS — Z01818 Encounter for other preprocedural examination: Secondary | ICD-10-CM

## 2022-10-27 DIAGNOSIS — Z01810 Encounter for preprocedural cardiovascular examination: Secondary | ICD-10-CM | POA: Insufficient documentation

## 2022-10-27 DIAGNOSIS — Z01812 Encounter for preprocedural laboratory examination: Secondary | ICD-10-CM | POA: Insufficient documentation

## 2022-10-27 DIAGNOSIS — R9431 Abnormal electrocardiogram [ECG] [EKG]: Secondary | ICD-10-CM | POA: Insufficient documentation

## 2022-10-27 DIAGNOSIS — M818 Other osteoporosis without current pathological fracture: Secondary | ICD-10-CM | POA: Insufficient documentation

## 2022-10-27 DIAGNOSIS — N6091 Unspecified benign mammary dysplasia of right breast: Secondary | ICD-10-CM | POA: Diagnosis present

## 2022-10-27 DIAGNOSIS — R7303 Prediabetes: Secondary | ICD-10-CM | POA: Insufficient documentation

## 2022-10-27 HISTORY — PX: BREAST BIOPSY: SHX20

## 2022-10-27 HISTORY — DX: Gastro-esophageal reflux disease without esophagitis: K21.9

## 2022-10-27 HISTORY — DX: Family history of other specified conditions: Z84.89

## 2022-10-27 LAB — BASIC METABOLIC PANEL
Anion gap: 7 (ref 5–15)
BUN: 12 mg/dL (ref 8–23)
CO2: 26 mmol/L (ref 22–32)
Calcium: 9 mg/dL (ref 8.9–10.3)
Chloride: 107 mmol/L (ref 98–111)
Creatinine, Ser: 0.69 mg/dL (ref 0.44–1.00)
GFR, Estimated: >60 mL/min (ref >60)
Glucose, Bld: 106 mg/dL — ABNORMAL HIGH (ref 70–99)
Potassium: 3.6 mmol/L (ref 3.5–5.1)
Sodium: 140 mmol/L (ref 135–145)

## 2022-10-27 LAB — CBC
HCT: 40.7 % (ref 36.0–46.0)
Hemoglobin: 13.7 g/dL (ref 12.0–15.0)
MCH: 30.3 pg (ref 26.0–34.0)
MCHC: 33.7 g/dL (ref 30.0–36.0)
MCV: 90 fL (ref 80.0–100.0)
Platelets: 268 10*3/uL (ref 150–400)
RBC: 4.52 MIL/uL (ref 3.87–5.11)
RDW: 12.3 % (ref 11.5–15.5)
WBC: 4.7 10*3/uL (ref 4.0–10.5)
nRBC: 0 % (ref 0.0–0.2)

## 2022-10-27 MED ORDER — LIDOCAINE HCL (PF) 1 % IJ SOLN
10.0000 mL | Freq: Once | INTRAMUSCULAR | Status: AC
  Administered 2022-10-27: 10 mL
  Filled 2022-10-27: qty 10

## 2022-10-27 NOTE — Patient Instructions (Addendum)
Your procedure is scheduled on: Wednesday, July 24 Report to the Registration Desk on the 1st floor of the [facility] To find out your arrival time, please call [phone] between 1PM - 3PM on: Tuesday , July 23 If your arrival time is 6:00 am, do not arrive before that time as the [facility] entrance doors do not open until 6:00 am.  REMEMBER: Instructions that are not followed completely may result in serious medical risk, up to and including death; or upon the discretion of your surgeon and anesthesiologist your surgery may need to be rescheduled.  Do not eat food after midnight the night before surgery.  No gum chewing or hard candies.   One week prior to surgery: Stop Anti-inflammatories (NSAIDS) such as Advil, Aleve, Ibuprofen, Motrin, Naproxen, Naprosyn and Aspirin based products such as Excedrin, Goody's Powder, BC Powder. Stop ANY OVER THE COUNTER supplements until after surgery: Calcium Carb-Cholecalciferol (OYSTER SHELL CALCIUM )   You may however, continue to take Tylenol if needed for pain up until the day of surgery.  Continue taking all prescribed medications with the exception of the following:  Follow recommendations from Cardiologist or PCP regarding stopping blood thinners.  TAKE ONLY THESE MEDICATIONS THE MORNING OF SURGERY WITH A SIP OF WATER:  omeprazole (PRILOSEC) (take one the night before and one on the morning of surgery - helps to prevent nausea after surgery.) venlafaxine XR (EFFEXOR-XR)   No Alcohol for 24 hours before or after surgery.  No Smoking including e-cigarettes for 24 hours before surgery.  No chewable tobacco products for at least 6 hours before surgery.  No nicotine patches on the day of surgery.  Do not use any "recreational" drugs for at least a week (preferably 2 weeks) before your surgery.  Please be advised that the combination of cocaine and anesthesia may have negative outcomes, up to and including death. If you test  positive for cocaine, your surgery will be cancelled.  On the morning of surgery brush your teeth with toothpaste and water, you may rinse your mouth with mouthwash if you wish. Do not swallow any toothpaste or mouthwash.  Use CHG Soap as directed on instruction sheet.  Do not wear jewelry, make-up, hairpins, clips or nail polish.  Do not wear lotions, powders, or perfumes.   Do not shave body hair from the neck down 48 hours before surgery.  Contact lenses, hearing aids and dentures may not be worn into surgery.  Do not bring valuables to the hospital. [facility] is not responsible for any missing/lost belongings or valuables.   Notify your doctor if there is any change in your medical condition (cold, fever, infection).  Wear comfortable clothing (specific to your surgery type) to the hospital.  After surgery, you can help prevent lung complications by doing breathing exercises.  Take deep breaths and cough every 1-2 hours.  If you are being discharged the day of surgery, you will not be allowed to drive home. You will need a responsible individual to drive you home and stay with you for 24 hours after surgery.   If you are taking public transportation, you will need to have a responsible individual with you.  Please call the Pre-admissions Testing Dept. at [phone] if you have any questions about these instructions.  Surgery Visitation Policy:  Patients having surgery or a procedure may have two visitors.  Children under the age of 69 must have an adult with them who is not the patient.  Preparing for Surgery with CHLORHEXIDINE GLUCONATE (CHG) Soap  Chlorhexidine Gluconate (CHG) Soap  o An antiseptic cleaner that kills germs and bonds with the skin to continue killing germs even after washing  o Used for showering the night before surgery and morning of surgery  Before surgery, you can play an important role by reducing the number of germs on your skin.   CHG (Chlorhexidine gluconate) soap is an antiseptic cleanser which kills germs and bonds with the skin to continue killing germs even after washing.  Please do not use if you have an allergy to CHG or antibacterial soaps. If your skin becomes reddened/irritated stop using the CHG.  1. Shower the NIGHT BEFORE SURGERY and the MORNING OF SURGERY with CHG soap.  2. If you choose to wash your hair, wash your hair first as usual with your normal shampoo.  3. After shampooing, rinse your hair and body thoroughly to remove the shampoo.  4. Use CHG as you would any other liquid soap. You can apply CHG directly to the skin and wash gently with a scrungie or a clean washcloth.  5. Apply the CHG soap to your body only from the neck down. Do not use on open wounds or open sores. Avoid contact with your eyes, ears, mouth, and genitals (private parts). Wash face and genitals (private parts) with your normal soap.  6. Wash thoroughly, paying special attention to the area where your surgery will be performed.  7. Thoroughly rinse your body with warm water.  8. Do not shower/wash with your normal soap after using and rinsing off the CHG soap.  9. Pat yourself dry with a clean towel.  10. Wear clean pajamas to bed the night before surgery.  12. Place clean sheets on your bed the night of your first shower and do not sleep with pets.  13. Shower again with the CHG soap on the day of surgery prior to arriving at the hospital.  14. Do not apply any deodorants/lotions/powders.  15. Please wear clean clothes to the hospital.

## 2022-10-29 ENCOUNTER — Ambulatory Visit: Payer: Medicare PPO

## 2022-10-29 ENCOUNTER — Other Ambulatory Visit: Payer: Self-pay

## 2022-10-29 ENCOUNTER — Encounter
Admission: RE | Disposition: A | Discharge status: Home / Self Care | Payer: Self-pay | Source: Home / Self Care | Attending: General Surgery

## 2022-10-29 ENCOUNTER — Ambulatory Visit
Admission: RE | Admit: 2022-10-29 | Discharge: 2022-10-29 | Disposition: A | Discharge status: Home / Self Care | Payer: Medicare PPO | Attending: General Surgery | Admitting: General Surgery

## 2022-10-29 ENCOUNTER — Ambulatory Visit
Admission: RE | Admit: 2022-10-29 | Discharge: 2022-10-29 | Disposition: A | Discharge status: Home / Self Care | Payer: Medicare PPO | Source: Ambulatory Visit | Attending: General Surgery | Admitting: General Surgery

## 2022-10-29 ENCOUNTER — Ambulatory Visit: Payer: Medicare PPO | Admitting: Urgent Care

## 2022-10-29 ENCOUNTER — Encounter: Payer: Self-pay | Admitting: General Surgery

## 2022-10-29 DIAGNOSIS — N6081 Other benign mammary dysplasias of right breast: Secondary | ICD-10-CM | POA: Insufficient documentation

## 2022-10-29 DIAGNOSIS — Z90710 Acquired absence of both cervix and uterus: Secondary | ICD-10-CM | POA: Diagnosis not present

## 2022-10-29 DIAGNOSIS — Z803 Family history of malignant neoplasm of breast: Secondary | ICD-10-CM | POA: Diagnosis not present

## 2022-10-29 DIAGNOSIS — N6091 Unspecified benign mammary dysplasia of right breast: Secondary | ICD-10-CM

## 2022-10-29 DIAGNOSIS — K219 Gastro-esophageal reflux disease without esophagitis: Secondary | ICD-10-CM | POA: Diagnosis not present

## 2022-10-29 DIAGNOSIS — E039 Hypothyroidism, unspecified: Secondary | ICD-10-CM | POA: Diagnosis not present

## 2022-10-29 DIAGNOSIS — Z808 Family history of malignant neoplasm of other organs or systems: Secondary | ICD-10-CM | POA: Diagnosis not present

## 2022-10-29 HISTORY — PX: BREAST BIOPSY WITH RADIO FREQUENCY LOCALIZER: SHX6895

## 2022-10-29 SURGERY — BREAST BIOPSY WITH RADIO FREQUENCY LOCALIZER
Anesthesia: General | Site: Breast | Laterality: Right

## 2022-10-29 MED ORDER — CEFAZOLIN SODIUM-DEXTROSE 2-4 GM/100ML-% IV SOLN
2.0000 g | INTRAVENOUS | Status: AC
  Administered 2022-10-29: 2 g via INTRAVENOUS

## 2022-10-29 MED ORDER — LACTATED RINGERS IV SOLN: INTRAVENOUS | Status: DC

## 2022-10-29 MED ORDER — HYDROCODONE-ACETAMINOPHEN 5-325 MG PO TABS: 1.0000 | ORAL_TABLET | ORAL | 0 refills | Status: AC | PRN

## 2022-10-29 MED ORDER — PROMETHAZINE HCL 25 MG/ML IJ SOLN: 6.2500 mg | INTRAMUSCULAR | Status: DC | PRN

## 2022-10-29 MED ORDER — LIDOCAINE HCL (PF) 2 % IJ SOLN
INTRAMUSCULAR | Status: AC
  Filled 2022-10-29: qty 5

## 2022-10-29 MED ORDER — OXYCODONE HCL 5 MG/5ML PO SOLN: 5.0000 mg | Freq: Once | ORAL | Status: AC | PRN

## 2022-10-29 MED ORDER — DEXAMETHASONE SODIUM PHOSPHATE 10 MG/ML IJ SOLN
INTRAMUSCULAR | Status: AC
  Filled 2022-10-29: qty 1

## 2022-10-29 MED ORDER — DEXAMETHASONE SODIUM PHOSPHATE 10 MG/ML IJ SOLN
INTRAMUSCULAR | Status: DC | PRN
  Administered 2022-10-29: 10 mg via INTRAVENOUS

## 2022-10-29 MED ORDER — PROPOFOL 10 MG/ML IV BOLUS
INTRAVENOUS | Status: DC | PRN
Start: 2022-10-29 — End: 2022-10-29
  Administered 2022-10-29: 120 mg via INTRAVENOUS

## 2022-10-29 MED ORDER — OXYCODONE HCL 5 MG PO TABS
ORAL_TABLET | ORAL | Status: AC
  Filled 2022-10-29: qty 1

## 2022-10-29 MED ORDER — ORAL CARE MOUTH RINSE: 15.0000 mL | Freq: Once | OROMUCOSAL | Status: AC

## 2022-10-29 MED ORDER — FENTANYL CITRATE (PF) 100 MCG/2ML IJ SOLN
INTRAMUSCULAR | Status: AC
  Filled 2022-10-29: qty 2

## 2022-10-29 MED ORDER — DROPERIDOL 2.5 MG/ML IJ SOLN: 0.6250 mg | Freq: Once | INTRAMUSCULAR | Status: DC | PRN

## 2022-10-29 MED ORDER — LIDOCAINE HCL (CARDIAC) PF 100 MG/5ML IV SOSY
PREFILLED_SYRINGE | INTRAVENOUS | Status: DC | PRN
  Administered 2022-10-29: 60 mg via INTRAVENOUS

## 2022-10-29 MED ORDER — CEFAZOLIN SODIUM-DEXTROSE 2-4 GM/100ML-% IV SOLN
INTRAVENOUS | Status: AC
  Filled 2022-10-29: qty 100

## 2022-10-29 MED ORDER — ONDANSETRON HCL 4 MG/2ML IJ SOLN
INTRAMUSCULAR | Status: AC
  Filled 2022-10-29: qty 2

## 2022-10-29 MED ORDER — FENTANYL CITRATE (PF) 100 MCG/2ML IJ SOLN
25.0000 ug | INTRAMUSCULAR | Status: DC | PRN
  Administered 2022-10-29 (×4): 25 ug via INTRAVENOUS

## 2022-10-29 MED ORDER — FENTANYL CITRATE (PF) 100 MCG/2ML IJ SOLN
INTRAMUSCULAR | Status: DC | PRN
  Administered 2022-10-29 (×2): 25 ug via INTRAVENOUS

## 2022-10-29 MED ORDER — ACETAMINOPHEN 10 MG/ML IV SOLN
INTRAVENOUS | Status: AC
  Filled 2022-10-29: qty 100

## 2022-10-29 MED ORDER — LACTATED RINGERS IV SOLN: INTRAVENOUS | Status: DC | PRN

## 2022-10-29 MED ORDER — MIDAZOLAM HCL 2 MG/2ML IJ SOLN
INTRAMUSCULAR | Status: AC
  Filled 2022-10-29: qty 2

## 2022-10-29 MED ORDER — BUPIVACAINE-EPINEPHRINE (PF) 0.5% -1:200000 IJ SOLN
INTRAMUSCULAR | Status: DC | PRN
  Administered 2022-10-29: 30 mL

## 2022-10-29 MED ORDER — PROPOFOL 10 MG/ML IV BOLUS
INTRAVENOUS | Status: AC
  Filled 2022-10-29: qty 20

## 2022-10-29 MED ORDER — BUPIVACAINE HCL (PF) 0.5 % IJ SOLN
INTRAMUSCULAR | Status: AC
  Filled 2022-10-29: qty 30

## 2022-10-29 MED ORDER — EPINEPHRINE PF 1 MG/ML IJ SOLN
INTRAMUSCULAR | Status: AC
  Filled 2022-10-29: qty 1

## 2022-10-29 MED ORDER — OXYCODONE HCL 5 MG PO TABS
5.0000 mg | ORAL_TABLET | Freq: Once | ORAL | Status: AC | PRN
  Administered 2022-10-29: 5 mg via ORAL

## 2022-10-29 MED ORDER — STERILE WATER FOR IRRIGATION IR SOLN
Status: DC | PRN
  Administered 2022-10-29: 300 mL

## 2022-10-29 MED ORDER — CHLORHEXIDINE GLUCONATE 0.12 % MT SOLN
OROMUCOSAL | Status: AC
  Filled 2022-10-29: qty 15

## 2022-10-29 MED ORDER — CHLORHEXIDINE GLUCONATE 0.12 % MT SOLN
15.0000 mL | Freq: Once | OROMUCOSAL | Status: AC
  Administered 2022-10-29: 15 mL via OROMUCOSAL

## 2022-10-29 MED ORDER — ACETAMINOPHEN 10 MG/ML IV SOLN: 1000.0000 mg | Freq: Once | INTRAVENOUS | Status: DC | PRN

## 2022-10-29 MED ORDER — DEXMEDETOMIDINE HCL IN NACL 80 MCG/20ML IV SOLN
INTRAVENOUS | Status: AC
  Filled 2022-10-29: qty 40

## 2022-10-29 MED ORDER — PHENYLEPHRINE HCL (PRESSORS) 10 MG/ML IV SOLN
INTRAVENOUS | Status: DC | PRN
  Administered 2022-10-29: 80 ug via INTRAVENOUS

## 2022-10-29 MED ORDER — ACETAMINOPHEN 10 MG/ML IV SOLN
INTRAVENOUS | Status: DC | PRN
  Administered 2022-10-29: 1000 mg via INTRAVENOUS

## 2022-10-29 MED ORDER — ONDANSETRON HCL 4 MG/2ML IJ SOLN
INTRAMUSCULAR | Status: DC | PRN
  Administered 2022-10-29: 4 mg via INTRAVENOUS

## 2022-10-29 MED ORDER — MIDAZOLAM HCL 2 MG/2ML IJ SOLN
INTRAMUSCULAR | Status: DC | PRN
  Administered 2022-10-29: 1 mg via INTRAVENOUS

## 2022-10-29 SURGICAL SUPPLY — 41 items
ADH SKN CLS APL DERMABOND .7 (GAUZE/BANDAGES/DRESSINGS) ×1
APL PRP STRL LF DISP 70% ISPRP (MISCELLANEOUS) ×1
BLADE SURG 15 STRL LF DISP TIS (BLADE) ×1 IMPLANT
BLADE SURG 15 STRL SS (BLADE) ×1
CHLORAPREP W/TINT 26 (MISCELLANEOUS) ×1 IMPLANT
CNTNR URN SCR LID CUP LEK RST (MISCELLANEOUS) ×1 IMPLANT
CONT SPEC 4OZ STRL OR WHT (MISCELLANEOUS)
DERMABOND ADVANCED .7 DNX12 (GAUZE/BANDAGES/DRESSINGS) ×1 IMPLANT
DEVICE DUBIN SPECIMEN MAMMOGRA (MISCELLANEOUS) ×1 IMPLANT
DRAPE LAPAROTOMY TRNSV 106X77 (MISCELLANEOUS) ×1 IMPLANT
ELECT CAUTERY BLADE TIP 2.5 (TIP) ×1
ELECT REM PT RETURN 9FT ADLT (ELECTROSURGICAL) ×1
ELECTRODE CAUTERY BLDE TIP 2.5 (TIP) ×1 IMPLANT
ELECTRODE REM PT RTRN 9FT ADLT (ELECTROSURGICAL) ×1 IMPLANT
GLOVE BIO SURGEON STRL SZ 6.5 (GLOVE) ×1 IMPLANT
GLOVE BIOGEL PI IND STRL 6.5 (GLOVE) ×1 IMPLANT
GOWN STRL REUS W/ TWL LRG LVL3 (GOWN DISPOSABLE) ×3 IMPLANT
GOWN STRL REUS W/TWL LRG LVL3 (GOWN DISPOSABLE) ×3
KIT MARKER MARGIN INK (KITS) IMPLANT
KIT TURNOVER KIT A (KITS) ×1 IMPLANT
LABEL OR SOLS (LABEL) ×1 IMPLANT
MANIFOLD NEPTUNE II (INSTRUMENTS) ×1 IMPLANT
MARKER MARGIN CORRECT CLIP (MARKER) IMPLANT
NDL HYPO 25X1 1.5 SAFETY (NEEDLE) ×1 IMPLANT
NEEDLE HYPO 25X1 1.5 SAFETY (NEEDLE) ×1 IMPLANT
PACK BASIN MINOR ARMC (MISCELLANEOUS) ×1 IMPLANT
RETRACTOR RING XSMALL (MISCELLANEOUS) IMPLANT
RTRCTR WOUND ALEXIS 13CM XS SH (MISCELLANEOUS)
SHEATH BREAST BIOPSY SKIN MKR (SHEATH) ×2 IMPLANT
SUT MNCRL 4-0 (SUTURE) ×1
SUT MNCRL 4-0 27XMFL (SUTURE) ×1
SUT SILK 2 0 SH (SUTURE) ×1 IMPLANT
SUT VIC AB 3-0 SH 27 (SUTURE) ×1
SUT VIC AB 3-0 SH 27X BRD (SUTURE) ×1 IMPLANT
SUTURE MNCRL 4-0 27XMF (SUTURE) ×1 IMPLANT
SYR 10ML LL (SYRINGE) ×1 IMPLANT
SYR BULB IRRIG 60ML STRL (SYRINGE) ×1 IMPLANT
TRAP FLUID SMOKE EVACUATOR (MISCELLANEOUS) ×1 IMPLANT
TRAP NEPTUNE SPECIMEN COLLECT (MISCELLANEOUS) ×1 IMPLANT
WATER STERILE IRR 1000ML POUR (IV SOLUTION) ×1 IMPLANT
WATER STERILE IRR 500ML POUR (IV SOLUTION) ×1 IMPLANT

## 2022-10-29 NOTE — Anesthesia Procedure Notes (Signed)
Procedure Name: LMA Insertion Date/Time: 10/29/2022 8:16 AM  Performed by: Merlene Pulling, CRNAPre-anesthesia Checklist: Patient identified, Patient being monitored, Timeout performed, Emergency Drugs available and Suction available Patient Re-evaluated:Patient Re-evaluated prior to induction Oxygen Delivery Method: Circle system utilized Preoxygenation: Pre-oxygenation with 100% oxygen Induction Type: IV induction Ventilation: Mask ventilation without difficulty LMA: LMA inserted LMA Size: 4.0 Tube type: Oral Number of attempts: 1 Placement Confirmation: positive ETCO2 and breath sounds checked- equal and bilateral Tube secured with: Tape Dental Injury: Teeth and Oropharynx as per pre-operative assessment

## 2022-10-29 NOTE — Anesthesia Postprocedure Evaluation (Signed)
Anesthesia Post Note  Patient: Krystal Mcintosh  Procedure(s) Performed: BREAST BIOPSY WITH RADIO FREQUENCY LOCALIZER (Right: Breast)  Patient location during evaluation: PACU Anesthesia Type: General Level of consciousness: awake and alert Pain management: pain level controlled Vital Signs Assessment: post-procedure vital signs reviewed and stable Respiratory status: spontaneous breathing, nonlabored ventilation and respiratory function stable Cardiovascular status: blood pressure returned to baseline and stable Postop Assessment: no apparent nausea or vomiting Anesthetic complications: no   No notable events documented.   Last Vitals:  Vitals:   10/29/22 1014 10/29/22 1023  BP: (!) 140/75 (!) 140/80  Pulse: 83 88  Resp: 14 15  Temp: 36.6 C 36.6 C  SpO2: 99% 98%    Last Pain:  Vitals:   10/29/22 1023  TempSrc: Temporal  PainSc:                  Foye Deer

## 2022-10-29 NOTE — Interval H&P Note (Signed)
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  The various methods of treatment have been discussed with the patient and family. After consideration of risks, benefits and other options for treatment, the patient has consented to  Procedure(s): BREAST BIOPSY WITH RADIO FREQUENCY LOCALIZER (Right) as a surgical intervention.  The patient's history has been reviewed, patient examined, no change in status, stable for surgery.  I have reviewed the patient's chart and labs.  Questions were answered to the patient's satisfaction.     Carolan Shiver

## 2022-10-29 NOTE — Discharge Instructions (Addendum)
  Diet: Resume home heart healthy regular diet.   Activity: No heavy lifting >20 pounds (children, pets, laundry, garbage) or strenuous activity until follow-up, but light activity and walking are encouraged. Do not drive or drink alcohol if taking narcotic pain medications.  Wound care: May shower with soapy water and pat dry (do not rub incisions), but no baths or submerging incision underwater until follow-up. (no swimming)   Medications: Resume all home medications. For mild to moderate pain: acetaminophen (Tylenol) or ibuprofen (if no kidney disease). Combining Tylenol with alcohol can substantially increase your risk of causing liver disease. Narcotic pain medications, if prescribed, can be used for severe pain, though may cause nausea, constipation, and drowsiness. Do not combine Tylenol and Norco within a 6 hour period as Norco contains Tylenol. If you do not need the narcotic pain medication, you do not need to fill the prescription.  Call office ([phone]) at any time if any questions, worsening pain, fevers/chills, bleeding, drainage from incision site, or other concerns.   AMBULATORY SURGERY  DISCHARGE INSTRUCTIONS   The drugs that you were given will stay in your system until tomorrow so for the next 24 hours you should not:  Drive an automobile Make any legal decisions Drink any alcoholic beverage   You may resume regular meals tomorrow.  Today it is better to start with liquids and gradually work up to solid foods.  You may eat anything you prefer, but it is better to start with liquids, then soup and crackers, and gradually work up to solid foods.   Please notify your doctor immediately if you have any unusual bleeding, trouble breathing, redness and pain at the surgery site, drainage, fever, or pain not relieved by medication.      Please contact your physician with any problems or Same Day Surgery at [phone], Monday through Friday 6 am to 4 pm, or [facility] at  Main number at [phone].  

## 2022-10-29 NOTE — Op Note (Signed)
Preoperative diagnosis: Right breast atypical ductal hyperplasia.  Postoperative diagnosis: Same.   Procedure: Right radiofrequency tag-localized excisional biopsy.                      Anesthesia: GETA  Surgeon: Dr. Hazle Quant  Wound Classification: Clean  Indications: Patient is a 67 y.o. female with a nonpalpable right breast mass noted on mammography with core biopsy demonstrating atypical ductal hyperplasia requires radiofrequency tag-localized excisional biopsy to rule out malignancy.   Findings: 1. Specimen mammography shows marker and tag on specimen 2. No other palpable mass or lymph node identified.   Description of procedure: Preoperative radiofrequency tag localization was performed by radiology. The patient was taken to the operating room and placed supine on the operating table, and after general anesthesia the right chest was prepped and draped in the usual sterile fashion. A time-out was completed verifying correct patient, procedure, site, positioning, and implant(s) and/or special equipment prior to beginning this procedure.  By comparing the localization studies and interrogation with [facility] device, the probable trajectory and location of the mass was visualized. A circumareolar skin incision was planned in such a way as to minimize the amount of dissection to reach the mass.  The skin incision was made. Flaps were raised and the location of the tag was confirmed with SaviScout device confirmed. A 2-0 silk figure-of-eight stay suture was placed and used for retraction. Dissection was then taken down circumferentially, taking care to include the entire localizing tag and a wide margin of grossly normal tissue. The specimen and entire localizing tag were removed. The specimen was oriented and sent to radiology with the localization studies. Biopsy marker was not in the specimen but it seem that the RF tag was in the better place from the biopsy site. Biopsy marker was  found in the suction trap. Confirmation was received that the entire target lesion had been resected. The wound was irrigated. Hemostasis was checked. The wound was closed with interrupted sutures of 3-0 Vicryl and a subcuticular suture of Monocryl 3-0. No attempt was made to close the dead space.   Specimen: Right excisional biopsy                     Deep margin  Complications: None  Estimated Blood Loss: 5 mL

## 2022-10-29 NOTE — Anesthesia Preprocedure Evaluation (Signed)
Anesthesia Evaluation  Patient identified by MRN, date of birth, ID band Patient awake    Reviewed: Allergy & Precautions, H&P , NPO status , Patient's Chart, lab work & pertinent test results  Airway Mallampati: II  TM Distance: >3 FB Neck ROM: full    Dental no notable dental hx.    Pulmonary neg pulmonary ROS   Pulmonary exam normal        Cardiovascular negative cardio ROS Normal cardiovascular exam     Neuro/Psych  PSYCHIATRIC DISORDERS  Depression    negative neurological ROS     GI/Hepatic Neg liver ROS,GERD  Medicated and Controlled,,  Endo/Other  Hypothyroidism    Renal/GU      Musculoskeletal  (+) Arthritis ,    Abdominal   Peds  Hematology negative hematology ROS (+)   Anesthesia Other Findings Past Medical History: 08/11/2016: Adult idiopathic generalized osteoporosis 04/22/2006: Basal cell carcinoma     Comment:  right med lower leg above ankle 06/29/2007: Basal cell carcinoma     Comment:  right chest mid parasternal 01/25/2014: Basal cell carcinoma     Comment:  right medial pretibial 08/30/2013: Bulging lumbar disc 08/30/2013: Cataract, bilateral 08/30/2013: Cervical disc disease 06/27/2019: Degeneration of lumbar intervertebral disc 07/26/2013: Dysplastic nevus     Comment:  right medial calf 05/22/2015: Erosive esophagitis No date: Family history of adverse reaction to anesthesia No date: GERD (gastroesophageal reflux disease) No date: Hx of basal cell carcinoma     Comment:  R nose txted in past 08/30/2013: Hyperlipidemia 1988: Liveborn infant by vaginal delivery     Comment:  female 64: Liveborn infant by vaginal delivery     Comment:  Female 03/14/2020: Major depressive disorder, recurrent, mild (HCC) 1980: Miscarriage 06/27/2019: Scoliosis deformity of spine  Past Surgical History: No date: ABDOMINAL HYSTERECTOMY 10/06/2022: BREAST BIOPSY; Right     Comment:  rt stereo  calcs, ribbon clip, path pending. 10/06/2022: BREAST BIOPSY; Right     Comment:  MM RT BREAST BX W LOC DEV 1ST LESION IMAGE BX SPEC               STEREO GUIDE 10/06/2022 [facility] 10/27/2022: BREAST BIOPSY; Right     Comment:  MM RT RADIO FREQUENCY TAG LOC MAMMO GUIDE 10/27/2022               [facility] 2012: BREAST EXCISIONAL BIOPSY; Left     Comment:  benign 11/2017: ROTATOR CUFF REPAIR No date: SHOULDER SURGERY  BMI    Body Mass Index: 26.52 kg/m      Reproductive/Obstetrics negative OB ROS                              Anesthesia Physical Anesthesia Plan  ASA: 2  Anesthesia Plan: General LMA   Post-op Pain Management: Toradol IV (intra-op)* and Ofirmev IV (intra-op)*   Induction: Intravenous  PONV Risk Score and Plan: Dexamethasone, Ondansetron, Midazolam and Treatment may vary due to age or medical condition  Airway Management Planned: LMA  Additional Equipment:   Intra-op Plan:   Post-operative Plan: Extubation in OR  Informed Consent: I have reviewed the patients History and Physical, chart, labs and discussed the procedure including the risks, benefits and alternatives for the proposed anesthesia with the patient or authorized representative who has indicated his/her understanding and acceptance.     Dental Advisory Given  Plan Discussed with: Anesthesiologist, CRNA and Surgeon  Anesthesia Plan Comments:  Anesthesia Quick Evaluation

## 2022-10-29 NOTE — Transfer of Care (Signed)
Immediate Anesthesia Transfer of Care Note  Patient: Krystal Mcintosh  Procedure(s) Performed: BREAST BIOPSY WITH RADIO FREQUENCY LOCALIZER (Right: Breast)  Patient Location: PACU  Anesthesia Type:General  Level of Consciousness: drowsy  Airway & Oxygen Therapy: Patient Spontanous Breathing and Patient connected to face mask oxygen  Post-op Assessment: Report given to RN and Post -op Vital signs reviewed and stable  Post vital signs: Reviewed  Last Vitals:  Vitals Value Taken Time  BP 145/82   Temp 60F   Pulse 88 10/29/22 0936  Resp 10 10/29/22 0936  SpO2 100 % 10/29/22 0936  Vitals shown include unfiled device data.  Last Pain:  Vitals:   10/29/22 0713  TempSrc: Temporal  PainSc: 0-No pain         Complications: No notable events documented.

## 2022-10-30 ENCOUNTER — Encounter: Payer: Self-pay | Admitting: General Surgery

## 2022-11-05 ENCOUNTER — Inpatient Hospital Stay: Payer: Medicare PPO | Attending: Oncology | Admitting: Oncology

## 2022-11-05 ENCOUNTER — Inpatient Hospital Stay: Payer: Medicare PPO

## 2022-11-05 ENCOUNTER — Encounter: Payer: Self-pay | Admitting: Oncology

## 2022-11-05 VITALS — BP 121/84 | HR 82 | Temp 97.9°F | Resp 18 | Wt 146.8 lb

## 2022-11-05 DIAGNOSIS — Z780 Asymptomatic menopausal state: Secondary | ICD-10-CM | POA: Diagnosis not present

## 2022-11-05 DIAGNOSIS — Z8049 Family history of malignant neoplasm of other genital organs: Secondary | ICD-10-CM | POA: Diagnosis not present

## 2022-11-05 DIAGNOSIS — Z85828 Personal history of other malignant neoplasm of skin: Secondary | ICD-10-CM | POA: Diagnosis not present

## 2022-11-05 DIAGNOSIS — N6099 Unspecified benign mammary dysplasia of unspecified breast: Secondary | ICD-10-CM | POA: Diagnosis present

## 2022-11-05 DIAGNOSIS — Z803 Family history of malignant neoplasm of breast: Secondary | ICD-10-CM

## 2022-11-05 DIAGNOSIS — Z808 Family history of malignant neoplasm of other organs or systems: Secondary | ICD-10-CM | POA: Diagnosis not present

## 2022-11-05 DIAGNOSIS — M8580 Other specified disorders of bone density and structure, unspecified site: Secondary | ICD-10-CM | POA: Insufficient documentation

## 2022-11-05 DIAGNOSIS — Z809 Family history of malignant neoplasm, unspecified: Secondary | ICD-10-CM

## 2022-11-05 NOTE — Progress Notes (Signed)
Hematology/Oncology Consult Note [phone] [phone]     REFERRING PROVIDER: Carolan Shiver, *    CHIEF COMPLAINTS/PURPOSE OF CONSULTATION:  Right breast atypical ductal hyperplastic  ASSESSMENT & PLAN:   Atypical ductal hyperplasia of breast Atypical ductal hyperplasia is associated with a generalized, bilateral increase in breast cancer risk. Recommendation  Active Surveillance: life time annual screening mammography as well as  history and physical examination every 6 to 12 months Option of endocrine therapy as chemoprevention was discussed.   Patient is postmenopausal, recommend aromatase inhibitor Rationale of using aromatase inhibitor -Arimidex  discussed with patient.  Side effects of Arimidex including but not limited to hot flash, muscle and joint pain, fatigue, mood swing, vaginal dryness/inching, loss of bone mineral density,hair thinning, heart problem, etc were discussed with patient.  Patient is undecided today. We will discuss again at her next visit after she gets repeat DEXA and genetic testing done.    Family history of cancer Refer to genetic counselor for genetic testing  Osteopenia Recommend calcium and vitamin D supplementation. Recommend patient to obtain repeat bone density-she will ask primary care provider to order   Orders Placed This Encounter  Procedures   Ambulatory referral to Genetics    Referral Priority:   Routine    Referral Type:   Consultation    Referral Reason:   Specialty Services Required    Number of Visits Requested:   1   Follow-up in September.  2024 All questions were answered. The patient knows to call the clinic with any problems, questions or concerns.  Rickard Patience, MD, PhD [facility] Hematology Oncology 11/05/2022    HISTORY OF PRESENTING ILLNESS:  Krystal Mcintosh 67 y.o. female presents to establish care for [facility]   09/16/2022, bilateral screening mammogram showed calcification in the  right breast warrants further examination.  No findings suspicious for malignancy in the left breast. 09/22/2022 unilateral right diagnostic mammogram showed indeterminate right breast calcifications. 10/06/2022 patient underwent right breast biopsy. Pathology showed foci of atypical ductal adenosis, 1 mm which with associated calcifications. Usual ductal hyperplasia, adenosis, microcysts and calcifications.  Focal pseudo angiomatous stromal hyperplasia  Patient was evaluated by Dr. Maia Plan underwent right breast lumpectomy on 10/29/2022. Pathology showed focus of residual atypical ductal hyperplasia with calcifications.  Margins are negative.  Cystic papillary apocrine metaplasia.  Scout tag present negative for malignancy.  Patient was referred to establish care with oncology for further evaluation of chemoprevention. Patient reports family history of cancer-great maternal aunt with breast cancer.  Menarche at age of 74-13 First live birth at age of 24 OCP use: about 20 years  History of total hysterectomy: yes, 2003, at age of 67 yo  She is postmenopausal History of HRT use: 21 years, she stopped in July 2024. History of chest radiation:  no  Number of previous breast biopsies:  left lumpectomy, pathology showed no cancer.    MEDICAL HISTORY:  Past Medical History:  Diagnosis Date   Adult idiopathic generalized osteoporosis 08/11/2016   Basal cell carcinoma 04/22/2006   right med lower leg above ankle   Basal cell carcinoma 06/29/2007   right chest mid parasternal   Basal cell carcinoma 01/25/2014   right medial pretibial   Bulging lumbar disc 08/30/2013   Cataract, bilateral 08/30/2013   Cervical disc disease 08/30/2013   Degeneration of lumbar intervertebral disc 06/27/2019   Dysplastic nevus 07/26/2013   right medial calf   Erosive esophagitis 05/22/2015   Family history of adverse reaction to anesthesia  GERD (gastroesophageal reflux disease)    Hx of basal cell  carcinoma    R nose txted in past   Hyperlipidemia 08/30/2013   Liveborn infant by vaginal delivery [birth year]   female   Liveborn infant by vaginal delivery 1991   Female   Major depressive disorder, recurrent, 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   BREAST BIOPSY Right 10/06/2022   MM RT BREAST BX W LOC DEV 1ST LESION IMAGE BX SPEC STEREO GUIDE 10/06/2022 [facility]   BREAST BIOPSY Right 10/27/2022   MM RT RADIO FREQUENCY TAG LOC MAMMO GUIDE 10/27/2022 [facility]   BREAST BIOPSY WITH RADIO FREQUENCY LOCALIZER Right 10/29/2022   Procedure: BREAST BIOPSY WITH RADIO FREQUENCY LOCALIZER;  Surgeon: Carolan Shiver, MD;  Location: [facility];  Service: General;  Laterality: Right;   BREAST EXCISIONAL BIOPSY Left 2012   benign   ROTATOR CUFF REPAIR  11/2017   SHOULDER SURGERY      SOCIAL HISTORY: Social History   Socioeconomic History   Marital status: Married    Spouse name: Aurther Loft   Number of children: Not on file   Years of education: Not on file   Highest education level: Not on file  Occupational History   Not on file  Tobacco Use   Smoking status: Never   Smokeless tobacco: Never  Vaping Use   Vaping status: Never Used  Substance and Sexual Activity   Alcohol use: Not Currently    Alcohol/week: 1.0 standard drink of alcohol    Types: 1 Glasses of wine per week    Comment: occasional glass of wine   Drug use: Not Currently   Sexual activity: Not on file  Other Topics Concern   Not on file  Social History Narrative   Not on file   Social Determinants of Health   Financial Resource Strain: Not on file  Food Insecurity: Not on file  Transportation Needs: Not on file  Physical Activity: Not on file  Stress: Not on file  Social  Connections: Not on file  Intimate Partner Violence: Not on file    FAMILY HISTORY: Family History  Problem Relation Age of Onset   Hypertension Mother    Thyroid disease Mother    Macular degeneration Mother    Kidney disease Mother    Heart disease Father    Pulmonary fibrosis Father    Aneurysm Father    Cancer Brother        thymus cancer   Cancer Maternal Grandmother        vulvar cancer   Aneurysm Paternal Grandmother    Melanoma Daughter    Breast cancer Maternal Aunt        great    ALLERGIES:  has No Known Allergies.  MEDICATIONS:  Current Outpatient Medications  Medication Sig Dispense Refill   acetaminophen (TYLENOL) 325 MG tablet Take 650 mg by mouth every 6 (six) hours as needed for mild pain.     Calcium Carb-Cholecalciferol (OYSTER SHELL CALCIUM) 500-400 MG-UNIT TABS Take 500 mg by mouth in the morning and at bedtime.     doxylamine, Sleep, (UNISOM) 25 MG tablet Take 25 mg by mouth at bedtime as needed for sleep.     levothyroxine (SYNTHROID) 50 MCG tablet Take 50 mcg by mouth daily  before breakfast.     omeprazole (PRILOSEC) 40 MG capsule Take 40 mg by mouth daily.     polyethylene glycol powder (GLYCOLAX/MIRALAX) 17 GM/SCOOP powder Take 17 g by mouth daily.     simvastatin (ZOCOR) 20 MG tablet Take 20 mg by mouth at bedtime.     temazepam (RESTORIL) 15 MG capsule Take 15 mg by mouth at bedtime as needed for sleep.     venlafaxine XR (EFFEXOR-XR) 37.5 MG 24 hr capsule Take 37.5 mg by mouth daily with breakfast.     vitamin B-12 (CYANOCOBALAMIN) 100 MCG tablet Take 100 mcg by mouth daily.     No current facility-administered medications for this visit.    Review of Systems  Constitutional:  Negative for appetite change, chills, fatigue and fever.  HENT:   Negative for hearing loss and voice change.   Eyes:  Negative for eye problems.  Respiratory:  Negative for chest tightness and cough.   Cardiovascular:  Negative for chest pain.  Gastrointestinal:   Negative for abdominal distention, abdominal pain and blood in stool.  Endocrine: Negative for hot flashes.  Genitourinary:  Negative for difficulty urinating and frequency.   Musculoskeletal:  Negative for arthralgias.  Skin:  Negative for itching and rash.  Neurological:  Negative for extremity weakness.  Hematological:  Negative for adenopathy.  Psychiatric/Behavioral:  Negative for confusion.      PHYSICAL EXAMINATION: ECOG PERFORMANCE STATUS: 0 - Asymptomatic  Vitals:   11/05/22 1453  BP: 121/84  Pulse: 82  Resp: 18  Temp: 97.9 F (36.6 C)   Filed Weights   11/05/22 1453  Weight: 146 lb 12.8 oz (66.6 kg)    Physical Exam Constitutional:      General: She is not in acute distress.    Appearance: She is not diaphoretic.  HENT:     Head: Normocephalic and atraumatic.  Eyes:     General: No scleral icterus. Cardiovascular:     Rate and Rhythm: Normal rate and regular rhythm.  Pulmonary:     Effort: Pulmonary effort is normal. No respiratory distress.  Abdominal:     General: There is no distension.     Palpations: Abdomen is soft.     Tenderness: There is no abdominal tenderness.  Musculoskeletal:        General: Normal range of motion.     Cervical back: Normal range of motion and neck supple.  Skin:    General: Skin is warm and dry.  Neurological:     Mental Status: She is alert and oriented to person, place, and time. Mental status is at baseline.     Cranial Nerves: No cranial nerve deficit.     Motor: No abnormal muscle tone.  Psychiatric:        Mood and Affect: Mood and affect normal.      LABORATORY DATA:  I have reviewed the data as listed    Latest Ref Rng & Units 10/27/2022    1:29 PM  CBC  WBC 4.0 - 10.5 K/uL 4.7   Hemoglobin 12.0 - 15.0 g/dL 09.3   Hematocrit 81.8 - 46.0 % 40.7   Platelets 150 - 400 K/uL 268       Latest Ref Rng & Units 10/27/2022    1:29 PM 02/22/2015    2:51 PM  CMP  Glucose 70 - 99 mg/dL 299    BUN 8 - 23 mg/dL  12    Creatinine 3.71 - 1.00 mg/dL 6.96  7.89   Sodium 381 -  145 mmol/L 140    Potassium 3.5 - 5.1 mmol/L 3.6    Chloride 98 - 111 mmol/L 107    CO2 22 - 32 mmol/L 26    Calcium 8.9 - 10.3 mg/dL 9.0       RADIOGRAPHIC STUDIES: I have personally reviewed the radiological images as listed and agreed with the findings in the report. MM Breast Surgical Specimen  Result Date: 10/29/2022 CLINICAL DATA:  Status post [facility] localized RIGHT breast lumpectomy. Patient is status post stereotactic guided biopsy demonstrating ADH. Of note, RIBBON clip was noted to be displaced at time of biopsy due to a hematoma. As such, residual calcifications were targeted for SAVI scout placement. EXAM: SPECIMEN RADIOGRAPH OF THE RIGHT BREAST COMPARISON:  Previous exam(s). FINDINGS: Status post excision of the RIGHT breast. The [facility] reflector is present with in 1 specimen. Second specimen was sent which does not demonstrate the RIBBON clip. IMPRESSION: Specimen radiograph of the RIGHT breast demonstrating only the SAVI scout. Of note, RIBBON clip was noted to be displaced from site of biopsy by at least 1 cm due to a hematoma and instead the residual calcifications were targeted for excision with the SAVI scout. Electronically Signed   By: Meda Klinefelter M.D.   On: 10/29/2022 09:13   MM RT RADIO FREQUENCY TAG LOC MAMMO GUIDE  Result Date: 10/27/2022 CLINICAL DATA:  Preoperative localization, prior to right breast excisional biopsy peer EXAM: NEEDLE LOCALIZATION OF THE RIGHT BREAST WITH MAMMO GUIDANCE COMPARISON:  Previous exam(s). FINDINGS: Patient presents for needle localization prior to right breast excisional biopsy. I met with the patient and we discussed the procedure of needle localization including benefits and alternatives. We discussed the high likelihood of a successful procedure. We discussed the risks of the procedure, including infection, bleeding, tissue injury, and further surgery. Informed,  written consent was given. The usual time-out protocol was performed immediately prior to the procedure. Using mammographic guidance, sterile technique, 1% lidocaine and a 10 cm SAVI SCOUT needle, the residual calcifications/the anterior border of the post biopsy hematoma were localized using a superior approach. Reflector function was confirmed with an auditory signal from the SAVI SCOUT guide. The images were marked for [employer]. IMPRESSION: Radar reflector localization of the right breast. No apparent complications. Electronically Signed   By: Ted Mcalpine M.D.   On: 10/27/2022 09:39

## 2022-11-05 NOTE — Assessment & Plan Note (Signed)
Atypical ductal hyperplasia is associated with a generalized, bilateral increase in breast cancer risk. Recommendation  Active Surveillance: life time annual screening mammography as well as  history and physical examination every 6 to 12 months Option of endocrine therapy as chemoprevention was discussed.   Patient is postmenopausal, recommend aromatase inhibitor Rationale of using aromatase inhibitor -Arimidex  discussed with patient.  Side effects of Arimidex including but not limited to hot flash, muscle and joint pain, fatigue, mood swing, vaginal dryness/inching, loss of bone mineral density,hair thinning, heart problem, etc were discussed with patient.  Patient is undecided today. We will discuss again at her next visit after she gets repeat DEXA and genetic testing done.

## 2022-11-05 NOTE — Assessment & Plan Note (Signed)
Recommend calcium and vitamin D supplementation. Recommend patient to obtain repeat bone density-she will ask primary care provider to order

## 2022-11-05 NOTE — Progress Notes (Signed)
Pt here to establish care for ADH of right breast.

## 2022-11-05 NOTE — Assessment & Plan Note (Signed)
Refer to genetic counselor for genetic testing. 

## 2022-11-11 ENCOUNTER — Encounter: Payer: Self-pay | Admitting: Licensed Clinical Social Worker

## 2022-11-11 ENCOUNTER — Inpatient Hospital Stay: Payer: Medicare PPO | Attending: Oncology | Admitting: Licensed Clinical Social Worker

## 2022-11-11 ENCOUNTER — Inpatient Hospital Stay: Payer: Medicare PPO

## 2022-11-11 DIAGNOSIS — Z808 Family history of malignant neoplasm of other organs or systems: Secondary | ICD-10-CM

## 2022-11-11 DIAGNOSIS — Z802 Family history of malignant neoplasm of other respiratory and intrathoracic organs: Secondary | ICD-10-CM | POA: Diagnosis not present

## 2022-11-11 DIAGNOSIS — Z1379 Encounter for other screening for genetic and chromosomal anomalies: Secondary | ICD-10-CM

## 2022-11-11 DIAGNOSIS — Z803 Family history of malignant neoplasm of breast: Secondary | ICD-10-CM | POA: Diagnosis not present

## 2022-11-11 NOTE — Progress Notes (Addendum)
REFERRING PROVIDER: Rickard Patience, MD [address] [place],  [place] [address]  PRIMARY PROVIDER:  Danella Penton, MD  PRIMARY REASON FOR VISIT:  1. Family history of melanoma   2. Family history of skin cancer   3. Family history of breast cancer   4. Family history of thymus cancer    I connected with [name] on 11/11/2022 at 3:00 PM EDT by MyChart video conference and verified that I am speaking with the correct person using two identifiers.    Patient location: home Provider location: [facility]  HISTORY OF PRESENT ILLNESS:   Krystal Mcintosh, a 67 y.o. female, was seen for a [facility] cancer genetics consultation at the request of Dr. Cathie Hoops due to a family history of cancer.  Krystal Mcintosh presents to clinic today to discuss the possibility of a hereditary predisposition to cancer, genetic testing, and to further clarify her future cancer risks, as well as potential cancer risks for family members.   CANCER HISTORY:  Krystal Mcintosh is a 67 y.o. female with no personal history of cancer aside from basal cell carcinoma.  RISK FACTORS:  Menarche was at age 90-13.  First live birth at age 8.  OCP use for approximately  20  years.  Ovaries intact: no.  Hysterectomy: yes.  Menopausal status: postmenopausal.  HRT use:  21  years. Colonoscopy: yes; normal. Mammogram within the last year: yes. Number of breast biopsies: had R breast bx in 2024 that showed ADH, R lumpectomy 10/29/2022.   Past Medical History:  Diagnosis Date   Adult idiopathic generalized osteoporosis 08/11/2016   Basal cell carcinoma 04/22/2006   right med lower leg above ankle   Basal cell carcinoma 06/29/2007   right chest mid parasternal   Basal cell carcinoma 01/25/2014   right medial pretibial   Bulging lumbar disc 08/30/2013   Cataract, bilateral 08/30/2013   Cervical disc disease 08/30/2013   Degeneration of lumbar intervertebral disc 06/27/2019   Dysplastic nevus 07/26/2013   right medial calf    Erosive esophagitis 05/22/2015   Family history of adverse reaction to anesthesia    GERD (gastroesophageal reflux disease)    Hx of basal cell carcinoma    R nose txted in past   Hyperlipidemia 08/30/2013   Liveborn infant by vaginal delivery 17   female   Liveborn infant by vaginal delivery 1991   Female   Major depressive disorder, recurrent, 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   BREAST BIOPSY Right 10/06/2022   MM RT BREAST BX W LOC DEV 1ST LESION IMAGE BX SPEC STEREO GUIDE 10/06/2022 [facility]   BREAST BIOPSY Right 10/27/2022   MM RT RADIO FREQUENCY TAG LOC MAMMO GUIDE 10/27/2022 [facility]   BREAST BIOPSY WITH RADIO FREQUENCY LOCALIZER Right 10/29/2022   Procedure: BREAST BIOPSY WITH RADIO FREQUENCY LOCALIZER;  Surgeon: Carolan Shiver, MD;  Location: [facility];  Service: General;  Laterality: Right;   BREAST EXCISIONAL BIOPSY Left 2012   benign   ROTATOR CUFF REPAIR  11/2017   SHOULDER SURGERY      FAMILY HISTORY:  We obtained a detailed, 4-generation family history.  Significant diagnoses are listed below: Family History  Problem Relation Age of Onset   Hypertension Mother    Thyroid disease Mother  Macular degeneration Mother    Kidney disease Mother    Heart disease Father    Pulmonary fibrosis Father    Aneurysm Father    Skin cancer Father    Cancer Brother        thymus cancer   Skin cancer Maternal Uncle    Cancer Maternal Grandmother        vulvar cancer   Aneurysm Paternal Grandmother    Melanoma Daughter 51   Breast cancer Other        mat great aunts x2, dx 46s-60s   Krystal Mcintosh has 1 daughter and 1 son. Her daughter, 35, had melanoma this year and history of basal cell carcinoma as a teen. Krystal Mcintosh has 1 brother who  was diagnosed with metastatic thymus cancer at 64. She also had a sister, no cancers.  Krystal Mcintosh mother is 65, no history of cancer. Maternal uncle had squamous cell skin cancer. Maternal grandmother had vulvar cancer and died over 38. She had 2 sisters that had breast cancer, both over age 30.   Krystal Mcintosh father had multiple squamous cell skin cancers. No other known cancers on this side of the family.  Krystal Mcintosh is unaware of previous family history of genetic testing for hereditary cancer risks. There is no reported Ashkenazi Jewish ancestry. There is no known consanguinity.    GENETIC COUNSELING ASSESSMENT: Ms. Hortin is a 67 y.o. female with a family history which is not particularly suggestive of a hereditary cancer syndrome and predisposition to cancer. We, therefore, discussed and recommended the following at today's visit.   DISCUSSION: We discussed that approximately 10% of cancer is hereditary. Most cases of hereditary breast cancer are associated with BRCA1/BRCA2 genes, although there are other genes associated with hereditary cancer as well. Cancers and risks are gene specific. We discussed that testing is beneficial for several reasons including knowing about cancer risks, identifying potential screening and risk-reduction options that may be appropriate, and to understand if other family members could be at risk for cancer and allow them to undergo genetic testing.   We reviewed the characteristics, features and inheritance patterns of hereditary cancer syndromes. We also discussed genetic testing, including the appropriate family members to test, the process of testing, insurance coverage and turn-around-time for results. We discussed the implications of a negative, positive and/or variant of uncertain significant result.   We discussed with Krystal Mcintosh that the family history does not meet insurance or NCCN criteria for genetic testing and, therefore, is not highly  consistent with a familial hereditary cancer syndrome.  We feel she is at low risk to harbor a gene mutation associated with such a condition. Thus, we did not recommend any genetic testing, at this time, and recommended Ms. Tutton continue to follow the cancer screening guidelines given by her primary healthcare provider. Should she be interested in testing in the future, we would be happy to coordinate this for her, but she may owe the patient pay price of $250.  Ms. Nauert was interested in knowing her breast risk based on the ADH. Based on the patient's personal and family history, a risk model, Rockne Menghini, was used to estimate her risk of developing breast cancer. This estimates her lifetime risk of developing breast cancer to be approximately 24.4%. This estimation does not consider any genetic testing results.  The patient's lifetime breast cancer risk is a preliminary estimate based on available information using one of several models endorsed by the American Cancer Society (ACS).  The ACS recommends consideration of breast MRI screening as an adjunct to mammography for patients at high risk (defined as 20% or greater lifetime risk).     Ms. Holthaus is already followed as high risk by Dr. Cathie Hoops.  PLAN: After considering the risks, benefits, and limitations, Ms. Mcelvain did not wish to pursue genetic testing at today's visit. We understand this decision and remain available to coordinate genetic testing at any time in the future. We, therefore, recommend Ms. Nakamura continue to follow the cancer screening guidelines given by her primary healthcare provider.  Ms. Swearengen questions were answered to her satisfaction today. Our contact information was provided should additional questions or concerns arise. Thank you for the referral and allowing [place] to share in the care of your patient.   Lacy Duverney, MS, [facility] Genetic Counselor [email or web address] Phone: [phone]  The  patient was seen for a total of 25 minutes in virtual genetic counseling.  Dr. Blake Divine was available for discussion regarding this case.   _______________________________________________________________________ For Office Staff:  Number of people involved in session: 1 Was an Intern/ student involved with case: no

## 2022-12-25 ENCOUNTER — Inpatient Hospital Stay: Payer: Medicare PPO | Admitting: Oncology

## 2023-01-12 ENCOUNTER — Inpatient Hospital Stay: Payer: Medicare PPO | Attending: Oncology | Admitting: Oncology

## 2023-01-12 ENCOUNTER — Encounter: Payer: Self-pay | Admitting: Oncology

## 2023-01-12 VITALS — BP 117/85 | HR 79 | Temp 96.5°F | Resp 18 | Wt 146.7 lb

## 2023-01-12 DIAGNOSIS — Z808 Family history of malignant neoplasm of other organs or systems: Secondary | ICD-10-CM | POA: Insufficient documentation

## 2023-01-12 DIAGNOSIS — Z803 Family history of malignant neoplasm of breast: Secondary | ICD-10-CM | POA: Diagnosis not present

## 2023-01-12 DIAGNOSIS — Z809 Family history of malignant neoplasm, unspecified: Secondary | ICD-10-CM | POA: Diagnosis not present

## 2023-01-12 DIAGNOSIS — Z8544 Personal history of malignant neoplasm of other female genital organs: Secondary | ICD-10-CM | POA: Insufficient documentation

## 2023-01-12 DIAGNOSIS — M8580 Other specified disorders of bone density and structure, unspecified site: Secondary | ICD-10-CM | POA: Insufficient documentation

## 2023-01-12 DIAGNOSIS — Z85828 Personal history of other malignant neoplasm of skin: Secondary | ICD-10-CM | POA: Insufficient documentation

## 2023-01-12 DIAGNOSIS — N6099 Unspecified benign mammary dysplasia of unspecified breast: Secondary | ICD-10-CM | POA: Diagnosis not present

## 2023-01-12 DIAGNOSIS — N6091 Unspecified benign mammary dysplasia of right breast: Secondary | ICD-10-CM | POA: Diagnosis present

## 2023-01-12 NOTE — Progress Notes (Signed)
Hematology/Oncology Progress note [phone] [phone]        REFERRING PROVIDER: Danella Penton, MD    CHIEF COMPLAINTS/PURPOSE OF CONSULTATION:  Right breast atypical ductal hyperplastic  ASSESSMENT & PLAN:   Atypical ductal hyperplasia of breast Atypical ductal hyperplasia is associated with a generalized, bilateral increase in breast cancer risk. Recommendation: Active Surveillance: life time annual screening mammography and annual MRI breast. -Tyrer Cuzick 24.4%  obtain MRI breast in Dec 2024, Mammogram in June 2025 Endocrine therapy: Tamoxifen option was discussed with patient.Rationale and side effects were reviewed with patient.   Patient declines.    Family history of cancer She was seen by genetic counselor, she decides not to get genetic testing done.    Osteopenia Recommend calcium and vitamin D supplementation. Osteopenia on DEXA August 2024,  FRAX 23% 10 year major fracture risk   Orders Placed This Encounter  Procedures   MR BREAST BILATERAL W WO CONTRAST INC CAD    Standing Status:   Future    Standing Expiration Date:   07/13/2023    Order Specific Question:   If indicated for the ordered procedure, I authorize the administration of contrast media per Radiology protocol    Answer:   Yes    Order Specific Question:   What is the patient's sedation requirement?    Answer:   No Sedation    Order Specific Question:   Does the patient have a pacemaker or implanted devices?    Answer:   No    Order Specific Question:   Radiology Contrast Protocol - do NOT remove file path    Answer:   [email or web address]    Order Specific Question:   Preferred imaging location?    Answer:   [facility] (table limit - 550lbs)   MM 3D SCREENING MAMMOGRAM BILATERAL BREAST    Standing Status:   Future    Standing Expiration Date:   01/12/2024    Order Specific Question:   Reason for Exam (SYMPTOM  OR DIAGNOSIS  REQUIRED)    Answer:   Hyperplasia of breast    Order Specific Question:   Preferred imaging location?    Answer:   [facility]   Follow-up  1 year All questions were answered. The patient knows to call the clinic with any problems, questions or concerns.  Rickard Patience, MD, PhD [facility] Hematology Oncology 01/12/2023    HISTORY OF PRESENTING ILLNESS:  Krystal Mcintosh 67 y.o. female presents to establish care for [facility]   09/16/2022, bilateral screening mammogram showed calcification in the right breast warrants further examination.  No findings suspicious for malignancy in the left breast. 09/22/2022 unilateral right diagnostic mammogram showed indeterminate right breast calcifications. 10/06/2022 patient underwent right breast biopsy. Pathology showed foci of atypical ductal adenosis, 1 mm which with associated calcifications. Usual ductal hyperplasia, adenosis, microcysts and calcifications.  Focal pseudo angiomatous stromal hyperplasia  Patient was evaluated by Dr. Maia Plan underwent right breast lumpectomy on 10/29/2022. Pathology showed focus of residual atypical ductal hyperplasia with calcifications.  Margins are negative.  Cystic papillary apocrine metaplasia.  Scout tag present negative for malignancy.  Patient was referred to establish care with oncology for further evaluation of chemoprevention. Patient reports family history of cancer-great maternal aunt with breast cancer.  Menarche at age of 35-13 First live birth at age of 48 OCP use: about 20 years  History of total hysterectomy: yes, 2003, at age of 67 yo  She is postmenopausal History of HRT use: 21 years,  she stopped in July 2024. History of chest radiation:  no  Number of previous breast biopsies:  left lumpectomy, pathology showed no cancer.   INTERVAL HISTORY Krystal Mcintosh is a 67 y.o. female who has above history reviewed by me today presents for follow up visit for ADH.  She has DEXA done during the  interval. Has osteopenia. Takes calcium and vitamin supplementation.  No new complaints.   MEDICAL HISTORY:  Past Medical History:  Diagnosis Date   Adult idiopathic generalized osteoporosis 08/11/2016   Basal cell carcinoma 04/22/2006   right med lower leg above ankle   Basal cell carcinoma 06/29/2007   right chest mid parasternal   Basal cell carcinoma 01/25/2014   right medial pretibial   Bulging lumbar disc 08/30/2013   Cataract, bilateral 08/30/2013   Cervical disc disease 08/30/2013   Degeneration of lumbar intervertebral disc 06/27/2019   Dysplastic nevus 07/26/2013   right medial calf   Erosive esophagitis 05/22/2015   Family history of adverse reaction to anesthesia    GERD (gastroesophageal reflux disease)    Hx of basal cell carcinoma    R nose txted in past   Hyperlipidemia 08/30/2013   Liveborn infant by vaginal delivery [birth year]   female   Liveborn infant by vaginal delivery 1991   Female   Major depressive disorder, recurrent, 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   BREAST BIOPSY Right 10/06/2022   MM RT BREAST BX W LOC DEV 1ST LESION IMAGE BX SPEC STEREO GUIDE 10/06/2022 [facility]   BREAST BIOPSY Right 10/27/2022   MM RT RADIO FREQUENCY TAG LOC MAMMO GUIDE 10/27/2022 [facility]   BREAST BIOPSY WITH RADIO FREQUENCY LOCALIZER Right 10/29/2022   Procedure: BREAST BIOPSY WITH RADIO FREQUENCY LOCALIZER;  Surgeon: Carolan Shiver, MD;  Location: [facility];  Service: General;  Laterality: Right;   BREAST EXCISIONAL BIOPSY Left 2012   benign   ROTATOR CUFF REPAIR  11/2017   SHOULDER SURGERY      SOCIAL HISTORY: Social History   Socioeconomic History   Marital status: Married    Spouse name: Aurther Loft   Number of children: Not on  file   Years of education: Not on file   Highest education level: Not on file  Occupational History   Not on file  Tobacco Use   Smoking status: Never   Smokeless tobacco: Never  Vaping Use   Vaping status: Never Used  Substance and Sexual Activity   Alcohol use: Not Currently    Alcohol/week: 1.0 standard drink of alcohol    Types: 1 Glasses of wine per week    Comment: occasional glass of wine   Drug use: Not Currently   Sexual activity: Not on file  Other Topics Concern   Not on file  Social History Narrative   Not on file   Social Determinants of Health   Financial Resource Strain: Not on file  Food Insecurity: Not on file  Transportation Needs: Not on file  Physical Activity: Not on file  Stress: Not on file  Social Connections: Not on file  Intimate Partner Violence: Not on file    FAMILY HISTORY: Family History  Problem Relation Age of Onset   Hypertension Mother    Thyroid disease Mother    Macular degeneration  Mother    Kidney disease Mother    Heart disease Father    Pulmonary fibrosis Father    Aneurysm Father    Skin cancer Father    Cancer Brother        thymus cancer   Skin cancer Maternal Uncle    Cancer Maternal Grandmother        vulvar cancer   Aneurysm Paternal Grandmother    Melanoma Daughter 30   Breast cancer Other        mat great aunts x2, dx 8s-60s    ALLERGIES:  has No Known Allergies.  MEDICATIONS:  Current Outpatient Medications  Medication Sig Dispense Refill   acetaminophen (TYLENOL) 325 MG tablet Take 650 mg by mouth every 6 (six) hours as needed for mild pain.     Calcium Carb-Cholecalciferol (OYSTER SHELL CALCIUM) 500-400 MG-UNIT TABS Take 500 mg by mouth in the morning and at bedtime.     doxylamine, Sleep, (UNISOM) 25 MG tablet Take 25 mg by mouth at bedtime as needed for sleep.     levothyroxine (SYNTHROID) 50 MCG tablet Take 50 mcg by mouth daily before breakfast.     omeprazole (PRILOSEC) 40 MG capsule Take 40 mg  by mouth daily.     polyethylene glycol powder (GLYCOLAX/MIRALAX) 17 GM/SCOOP powder Take 17 g by mouth daily.     simvastatin (ZOCOR) 20 MG tablet Take 20 mg by mouth at bedtime.     temazepam (RESTORIL) 15 MG capsule Take 15 mg by mouth at bedtime as needed for sleep.     venlafaxine XR (EFFEXOR-XR) 37.5 MG 24 hr capsule Take 37.5 mg by mouth daily with breakfast.     vitamin B-12 (CYANOCOBALAMIN) 100 MCG tablet Take 100 mcg by mouth daily.     No current facility-administered medications for this visit.    Review of Systems  Constitutional:  Negative for appetite change, chills, fatigue and fever.  HENT:   Negative for hearing loss and voice change.   Eyes:  Negative for eye problems.  Respiratory:  Negative for chest tightness and cough.   Cardiovascular:  Negative for chest pain.  Gastrointestinal:  Negative for abdominal distention, abdominal pain and blood in stool.  Endocrine: Negative for hot flashes.  Genitourinary:  Negative for difficulty urinating and frequency.   Musculoskeletal:  Negative for arthralgias.  Skin:  Negative for itching and rash.  Neurological:  Negative for extremity weakness.  Hematological:  Negative for adenopathy.  Psychiatric/Behavioral:  Negative for confusion.      PHYSICAL EXAMINATION: ECOG PERFORMANCE STATUS: 0 - Asymptomatic  Vitals:   01/12/23 1117  BP: 117/85  Pulse: 79  Resp: 18  Temp: (!) 96.5 F (35.8 C)   Filed Weights   01/12/23 1117  Weight: 146 lb 11.2 oz (66.5 kg)    Physical Exam Constitutional:      General: She is not in acute distress.    Appearance: She is not diaphoretic.  HENT:     Head: Normocephalic and atraumatic.  Eyes:     General: No scleral icterus. Cardiovascular:     Rate and Rhythm: Normal rate.  Pulmonary:     Effort: Pulmonary effort is normal. No respiratory distress.  Abdominal:     General: There is no distension.     Palpations: Abdomen is soft.     Tenderness: There is no abdominal  tenderness.  Musculoskeletal:        General: Normal range of motion.     Cervical back: Normal range  of motion and neck supple.  Skin:    General: Skin is warm and dry.  Neurological:     Mental Status: She is alert and oriented to person, place, and time. Mental status is at baseline.     Motor: No abnormal muscle tone.  Psychiatric:        Mood and Affect: Mood and affect normal.      LABORATORY DATA:  I have reviewed the data as listed    Latest Ref Rng & Units 10/27/2022    1:29 PM  CBC  WBC 4.0 - 10.5 K/uL 4.7   Hemoglobin 12.0 - 15.0 g/dL 78.2   Hematocrit 95.6 - 46.0 % 40.7   Platelets 150 - 400 K/uL 268       Latest Ref Rng & Units 10/27/2022    1:29 PM 02/22/2015    2:51 PM  CMP  Glucose 70 - 99 mg/dL 213    BUN 8 - 23 mg/dL 12    Creatinine 0.86 - 1.00 mg/dL 5.78  4.69   Sodium 629 - 145 mmol/L 140    Potassium 3.5 - 5.1 mmol/L 3.6    Chloride 98 - 111 mmol/L 107    CO2 22 - 32 mmol/L 26    Calcium 8.9 - 10.3 mg/dL 9.0       RADIOGRAPHIC STUDIES: I have personally reviewed the radiological images as listed and agreed with the findings in the report. No results found.

## 2023-01-12 NOTE — Assessment & Plan Note (Addendum)
Atypical ductal hyperplasia is associated with a generalized, bilateral increase in breast cancer risk. Recommendation: Active Surveillance: life time annual screening mammography and annual MRI breast. -Tyrer Cuzick 24.4%  obtain MRI breast in Dec 2024, Mammogram in June 2025 Endocrine therapy: Tamoxifen option was discussed with patient.Rationale and side effects were reviewed with patient.   Patient declines.

## 2023-01-12 NOTE — Assessment & Plan Note (Signed)
Recommend calcium and vitamin D supplementation. Osteopenia on DEXA August 2024,  FRAX 23% 10 year major fracture risk

## 2023-01-12 NOTE — Assessment & Plan Note (Signed)
She was seen by genetic counselor, she decides not to get genetic testing done.

## 2023-02-02 ENCOUNTER — Encounter: Payer: Self-pay | Admitting: Oncology

## 2023-02-18 ENCOUNTER — Ambulatory Visit: Payer: Medicare PPO | Admitting: Dermatology

## 2023-02-18 ENCOUNTER — Encounter: Payer: Self-pay | Admitting: Dermatology

## 2023-02-18 DIAGNOSIS — L821 Other seborrheic keratosis: Secondary | ICD-10-CM

## 2023-02-18 DIAGNOSIS — W908XXA Exposure to other nonionizing radiation, initial encounter: Secondary | ICD-10-CM | POA: Diagnosis not present

## 2023-02-18 DIAGNOSIS — L820 Inflamed seborrheic keratosis: Secondary | ICD-10-CM

## 2023-02-18 DIAGNOSIS — L578 Other skin changes due to chronic exposure to nonionizing radiation: Secondary | ICD-10-CM | POA: Diagnosis not present

## 2023-02-18 NOTE — Patient Instructions (Signed)
Cryotherapy Aftercare  Wash gently with soap and water everyday.   Apply Vaseline Jelly daily until healed.     Due to recent changes in healthcare laws, you may see results of your pathology and/or laboratory studies on MyChart before the doctors have had a chance to review them. We understand that in some cases there may be results that are confusing or concerning to you. Please understand that not all results are received at the same time and often the doctors may need to interpret multiple results in order to provide you with the best plan of care or course of treatment. Therefore, we ask that you please give [place] 2 business days to thoroughly review all your results before contacting the office for clarification. Should we see a critical lab result, you will be contacted sooner.   If You Need Anything After Your Visit  If you have any questions or concerns for your doctor, please call our main line at [phone] and press option 4 to reach your doctor's medical assistant. If no one answers, please leave a voicemail as directed and we will return your call as soon as possible. Messages left after 4 pm will be answered the following business day.   You may also send [place] a message via MyChart. We typically respond to MyChart messages within 1-2 business days.  For prescription refills, please ask your pharmacy to contact our office. Our fax number is [phone].  If you have an urgent issue when the clinic is closed that cannot wait until the next business day, you can page your doctor at the number below.    Please note that while we do our best to be available for urgent issues outside of office hours, we are not available 24/7.   If you have an urgent issue and are unable to reach [place], you may choose to seek medical care at your doctor's office, retail clinic, urgent care center, or emergency room.  If you have a medical emergency, please immediately call 911 or go to the emergency  department.  Pager Numbers  - Dr. Gwen Pounds: [phone]  - Dr. Roseanne Reno: [phone]  - Dr. Katrinka Blazing: [phone]   In the event of inclement weather, please call our main line at [phone] for an update on the status of any delays or closures.  Dermatology Medication Tips: Please keep the boxes that topical medications come in in order to help keep track of the instructions about where and how to use these. Pharmacies typically print the medication instructions only on the boxes and not directly on the medication tubes.   If your medication is too expensive, please contact our office at [phone] option 4 or send [place] a message through MyChart.   We are unable to tell what your co-pay for medications will be in advance as this is different depending on your insurance coverage. However, we may be able to find a substitute medication at lower cost or fill out paperwork to get insurance to cover a needed medication.   If a prior authorization is required to get your medication covered by your insurance company, please allow [place] 1-2 business days to complete this process.  Drug prices often vary depending on where the prescription is filled and some pharmacies may offer cheaper prices.  The website www.goodrx.com contains coupons for medications through different pharmacies. The prices here do not account for what the cost may be with help from insurance (it may be cheaper with your insurance), but the website can  give you the price if you did not use any insurance.  - You can print the associated coupon and take it with your prescription to the pharmacy.  - You may also stop by our office during regular business hours and pick up a GoodRx coupon card.  - If you need your prescription sent electronically to a different pharmacy, notify our office through [facility] or by phone at [phone] option 4.     Si Usted Necesita Algo Despus de Su Visita  Tambin puede enviarnos  un mensaje a travs de Clinical cytogeneticist. Por lo general respondemos a los mensajes de MyChart en el transcurso de 1 a 2 das hbiles.  Para renovar recetas, por favor pida a su farmacia que se ponga en contacto con nuestra oficina. Annie Sable de fax es [place] [phone].  Si tiene un asunto urgente cuando la clnica est cerrada y que no puede esperar hasta el siguiente da hbil, puede llamar/localizar a su doctor(a) al nmero que aparece a continuacin.   Por favor, tenga en cuenta que aunque hacemos todo lo posible para estar disponibles para asuntos urgentes fuera del horario de [place], no estamos disponibles las 24 horas del da, los 7 [address]  [address] de la [place].   Si tiene un problema urgente y no puede comunicarse con nosotros, puede optar por buscar atencin mdica  en el consultorio de su doctor(a), en una clnica privada, en un centro de atencin urgente o en una sala de emergencias.  Si tiene Engineer, drilling, por favor llame inmediatamente al 911 o vaya a la sala de emergencias.  Nmeros de bper  - Dr. Gwen Pounds: [phone]  - Dra. Roseanne Reno: [phone]  - Dr. Katrinka Blazing: [phone]   En caso de inclemencias del tiempo, por favor llame a Lacy Duverney principal al [phone] para una actualizacin sobre el [place] de cualquier retraso o cierre.  Consejos para la medicacin en dermatologa: Por favor, guarde las cajas en las que vienen los medicamentos de uso tpico para ayudarle a seguir las instrucciones sobre dnde y cmo usarlos. Las farmacias generalmente imprimen las instrucciones del medicamento slo en las cajas y no directamente en los tubos del [place].   Si su medicamento es muy caro, por favor, pngase en contacto con Rolm Gala llamando al [phone] y presione la opcin 4 o envenos un mensaje a travs de Clinical cytogeneticist.   No podemos decirle cul ser su copago por los medicamentos por adelantado ya que esto es diferente dependiendo de la cobertura de su seguro. Sin  embargo, es posible que podamos encontrar un medicamento sustituto a Audiological scientist un formulario para que el seguro cubra el medicamento que se considera necesario.   Si se requiere una autorizacin previa para que su compaa de seguros [place] su medicamento, por favor permtanos de 1 a 2 das hbiles para completar [address]  Los precios de los medicamentos varan con frecuencia dependiendo del Environmental consultant de dnde se surte la receta y alguna farmacias pueden ofrecer precios ms baratos.  El sitio web www.goodrx.com tiene cupones para medicamentos de Health and safety inspector. Los precios aqu no tienen en cuenta lo que podra costar con la ayuda del seguro (puede ser ms barato con su seguro), pero el sitio web puede darle el precio si no utiliz Tourist information centre manager.  - Puede imprimir el cupn correspondiente y llevarlo con su receta a la farmacia.  - Tambin puede pasar por nuestra oficina durante el horario de atencin regular y Education officer, museum una tarjeta de cupones de GoodRx.  -  Si necesita que su receta se enve electrnicamente a Psychiatrist, informe a nuestra oficina a travs de MyChart de [place] o por telfono llamando al [phone] y presione la opcin 4.

## 2023-02-18 NOTE — Progress Notes (Signed)
   Follow-Up Visit   Subjective  Krystal Mcintosh is a 67 y.o. female who presents for the following: 6 month ISK follow up. Scalp. Tx with LN2 08/20/2022. Has more areas to treat today. Raised, rough, itchy.  The patient has spots, moles and lesions to be evaluated, some may be new or changing and the patient may have concern these could be cancer.    The following portions of the chart were reviewed this encounter and updated as appropriate: medications, allergies, medical history  Review of Systems:  No other skin or systemic complaints except as noted in HPI or Assessment and Plan.  Objective  Well appearing patient in no apparent distress; mood and affect are within normal limits.  A focused examination was performed of the following areas: Scalp, face, neck, upper chest  Relevant physical exam findings are noted in the Assessment and Plan.  Scalp, neck, shoulders x31 (31) Erythematous keratotic or waxy stuck-on papule or plaque.    Assessment & Plan   Inflamed seborrheic keratosis (31) Scalp, neck, shoulders x31  Symptomatic, irritating, patient would like treated.  Destruction of lesion - Scalp, neck, shoulders x31 (31) Complexity: simple   Destruction method: cryotherapy   Informed consent: discussed and consent obtained   Timeout:  patient name, date of birth, surgical site, and procedure verified Lesion destroyed using liquid nitrogen: Yes   Region frozen until ice ball extended beyond lesion: Yes   Outcome: patient tolerated procedure well with no complications   Post-procedure details: wound care instructions given   Additional details:  Prior to procedure, discussed risks of blister formation, small wound, skin dyspigmentation, or rare scar following cryotherapy. Recommend Vaseline ointment to treated areas while healing.    SEBORRHEIC KERATOSIS - Stuck-on, waxy, tan-brown papules and/or plaques at neck/chest - Benign-appearing - Discussed benign  etiology and prognosis. - Observe - Call for any changes  ACTINIC DAMAGE - chronic, secondary to cumulative UV radiation exposure/sun exposure over time - diffuse scaly erythematous macules with underlying dyspigmentation - Recommend daily broad spectrum sunscreen SPF 30+ to sun-exposed areas, reapply every 2 hours as needed.  - Recommend staying in the shade or wearing long sleeves, sun glasses (UVA+UVB protection) and wide brim hats (4-inch brim around the entire circumference of the hat). - Call for new or changing lesions.   Return for TBSE As Scheduled, With Dr. Gwen Pounds.  I, Lawson Radar, CMA, am acting as scribe for Armida Sans, MD.   Documentation: I have reviewed the above documentation for accuracy and completeness, and I agree with the above.  Armida Sans, MD

## 2023-03-11 ENCOUNTER — Other Ambulatory Visit: Payer: Medicare PPO

## 2023-03-12 ENCOUNTER — Ambulatory Visit
Admission: RE | Admit: 2023-03-12 | Discharge: 2023-03-12 | Disposition: A | Discharge status: Home / Self Care | Payer: Medicare PPO | Source: Ambulatory Visit | Attending: Oncology | Admitting: Oncology

## 2023-03-12 DIAGNOSIS — N6099 Unspecified benign mammary dysplasia of unspecified breast: Secondary | ICD-10-CM | POA: Insufficient documentation

## 2023-03-12 DIAGNOSIS — Z809 Family history of malignant neoplasm, unspecified: Secondary | ICD-10-CM | POA: Diagnosis present

## 2023-03-12 MED ORDER — GADOBUTROL 1 MMOL/ML IV SOLN
6.0000 mL | Freq: Once | INTRAVENOUS | Status: AC | PRN
  Administered 2023-03-12: 6 mL via INTRAVENOUS

## 2023-03-13 ENCOUNTER — Telehealth: Payer: Self-pay

## 2023-03-13 ENCOUNTER — Other Ambulatory Visit: Payer: Self-pay

## 2023-03-13 DIAGNOSIS — N6099 Unspecified benign mammary dysplasia of unspecified breast: Secondary | ICD-10-CM

## 2023-03-13 NOTE — Telephone Encounter (Signed)
-----   Message from Rickard Patience sent at 03/12/2023 10:17 PM EST ----- Please arrange her to get bilateral screening mammogram in June 2025 thanks.

## 2023-03-13 NOTE — Telephone Encounter (Signed)
Krystal Mcintosh can you please schedule and notify patient. Thank You!

## 2023-03-17 ENCOUNTER — Telehealth: Payer: Self-pay

## 2023-03-17 NOTE — Telephone Encounter (Signed)
Patient left a voicemail that due to recent stress she is having frequent fever blister outbreaks, and she wanted to know if you could prescribe her medication like you did many years ago. Would you like for me to send in Valtrex 1 gram take 2 po at onset of outbreak, and 2 more 12h later #30 [id number]?

## 2023-03-18 MED ORDER — VALACYCLOVIR HCL 1 G PO TABS: ORAL_TABLET | ORAL | 11 refills | Status: AC

## 2023-03-18 NOTE — Telephone Encounter (Signed)
Discussed valtrex directions and medication sent to pharmacy.

## 2023-04-28 ENCOUNTER — Encounter: Payer: Self-pay | Admitting: Physical Therapy

## 2023-04-28 ENCOUNTER — Ambulatory Visit: Payer: Medicare PPO | Attending: Internal Medicine | Admitting: Physical Therapy

## 2023-04-28 DIAGNOSIS — G8929 Other chronic pain: Secondary | ICD-10-CM | POA: Diagnosis present

## 2023-04-28 DIAGNOSIS — M5442 Lumbago with sciatica, left side: Secondary | ICD-10-CM | POA: Diagnosis present

## 2023-04-28 DIAGNOSIS — R2689 Other abnormalities of gait and mobility: Secondary | ICD-10-CM | POA: Insufficient documentation

## 2023-04-28 DIAGNOSIS — M533 Sacrococcygeal disorders, not elsewhere classified: Secondary | ICD-10-CM | POA: Diagnosis present

## 2023-04-28 NOTE — Patient Instructions (Signed)
Replace shoe lift in toe box and heel every 6 months  Emailed supplier for shoe lifts

## 2023-04-28 NOTE — Therapy (Signed)
OUTPATIENT PHYSICAL THERAPY EVALUATION   Patient Name: Krystal Mcintosh MRN: [id number] DOB:December 20, 1955, 68 y.o., female Today's Date: 04/28/2023   PT End of Session - 04/28/23 0856     Visit Number 1    Number of Visits 10    Date for PT Re-Evaluation 07/07/23    PT Start Time 0851    PT Stop Time 0930    PT Time Calculation (min) 39 min    Activity Tolerance Patient tolerated treatment well;No increased pain    Behavior During Therapy [facility] for tasks assessed/performed             Past Medical History:  Diagnosis Date   Adult idiopathic generalized osteoporosis 08/11/2016   Basal cell carcinoma 04/22/2006   right med lower leg above ankle   Basal cell carcinoma 06/29/2007   right chest mid parasternal   Basal cell carcinoma 01/25/2014   right medial pretibial   Bulging lumbar disc 08/30/2013   Cataract, bilateral 08/30/2013   Cervical disc disease 08/30/2013   Degeneration of lumbar intervertebral disc 06/27/2019   Dysplastic nevus 07/26/2013   right medial calf   Erosive esophagitis 05/22/2015   Family history of adverse reaction to anesthesia    GERD (gastroesophageal reflux disease)    Hx of basal cell carcinoma    R nose txted in past   Hyperlipidemia 08/30/2013   Liveborn infant by vaginal delivery 17   female   Liveborn infant by vaginal delivery 1991   Female   Major depressive disorder, recurrent, 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   Miscarriage 1980   Scoliosis deformity of spine 06/27/2019   Past Surgical History:  Procedure Laterality Date   ABDOMINAL HYSTERECTOMY     BREAST BIOPSY Right 10/06/2022   rt stereo calcs, ribbon clip, path pending.   BREAST BIOPSY Right 10/06/2022   MM RT BREAST BX W LOC DEV 1ST LESION IMAGE BX SPEC STEREO GUIDE 10/06/2022 [facility]   BREAST BIOPSY Right 10/27/2022   MM RT RADIO FREQUENCY TAG LOC MAMMO GUIDE 10/27/2022 [facility]   BREAST BIOPSY WITH RADIO FREQUENCY LOCALIZER Right 10/29/2022   Procedure: BREAST BIOPSY  WITH RADIO FREQUENCY LOCALIZER;  Surgeon: Carolan Shiver, MD;  Location: [facility];  Service: General;  Laterality: Right;   BREAST EXCISIONAL BIOPSY Left 2012   benign   ROTATOR CUFF REPAIR  11/2017   SHOULDER SURGERY     Patient Active Problem List   Diagnosis Date Noted   Atypical ductal hyperplasia of breast 11/05/2022   Family history of cancer 11/05/2022   Osteopenia 11/05/2022   Major depressive disorder, recurrent, 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   Degeneration of lumbar intervertebral disc 06/27/2019   Scoliosis deformity of spine 06/27/2019   Medicare annual wellness visit, initial 10/15/2018   Adult idiopathic generalized osteoporosis 08/11/2016   Hyperlipidemia, mixed 08/11/2016   Erosive esophagitis 05/22/2015   Cervical disc disease 08/30/2013    PCP: Hyacinth Meeker   REFERRING PROVIDER: Philippa Chester DIAG: N39.41 (ICD-10-CM) - Urge incontinence   Rationale for Evaluation and Treatment Rehabilitation  THERAPY DIAG:  Sacrococcygeal disorders, not elsewhere classified  Chronic left-sided low back pain with left-sided sciatica  Other abnormalities of gait and mobility  ONSET DATE:   SUBJECTIVE:  SUBJECTIVE STATEMENT ON 04/28/23 EVAL : 1) urinary leakage:  pt had leakage 3 x times last week , two times when she was with friends, returning home. Pt is not sure how it happened. She tried to get to the bathroom and get her pants down and it did not work.  2) incomplete emptying:  has leakage after peeing ( incomplete urination) and she has to return to the toilet 25% of the time  3) L LBP, with radiating pain to mid lateral thigh  8/10 , it was bad last week and she could not stand up from sitting, One week of 8/10 pain , three days , pain decreased to 3/10.  Pt iced and heated the area for 3  days. Used heating pads. Pt is not aware of what activities trigger the pain.  Since Nov 2024,  Pt has to bend when cleaning husband who has dementia and is incontinence. She sometimes tries to remember to bend her knees when wiping and cleaning her husband.  She had to clean him 4-5x  a day.   Pt has her shoe lift in toe box and heel of L shoe from past Pelvic PT sessions for her leg length difference but she has not replaced new ones.   PERTINENT HISTORY:  Pt is now caring for her husband who has frontal temporal behavioral dementia which requires her to pick up after him, clean him, do laundry 2 a day because he needs 2nd change of clothes. She can not leave him alone and she does not have a regular sitter or aide yet.  Pt also cares for her mom but she has not been able to get over there.  Pt also gets to see her grandson for 2 evenings a week 4-5 hours at her dtr house which also includes caring for husband during time.    PAIN:  Are you having pain? Yes: see above   PRECAUTIONS: no   WEIGHT BEARING RESTRICTIONS: no  FALLS:  Has patient fallen in last 6 months? No   LIVING ENVIRONMENT: Lives with: husband  Lives in: one story  Stairs: 3  STE with rail , 6-7 STE through garage    OCCUPATION: retired,   PLOF: IND   PATIENT GOALS:  Help her back .  Learn how to handle without panicking about leaking, review past Pelvic PT exercises    OBJECTIVE:   [facility] PT Assessment - 04/28/23 0919       Palpation   SI assessment  levelled pelvis, L shoulder lowered  (shoe lift in toe box/ heel in L shoe: levelled shoulder and pelvis)      Ambulation/Gait   Gait Comments 1.38 m/s, excessive sway to L,  (after new shoe lift replaced: 1.58 m/s) ,             [facility] Adult PT Treatment/Exercise - 04/28/23 0919       Therapeutic Activites    Therapeutic Activities Other Therapeutic Activities    Other Therapeutic Activities fitted L shoe with shoe lifts in toe box, heel, provided email  to buy more and replace every 6 months               HOME EXERCISE PROGRAM: See pt instruction section    ASSESSMENT:  CLINICAL IMPRESSION:  Pt is a  68  yo  who presents with  following issues which impact QOL, ADL,  fitness, social and community activities:    urinary leakage  incomplete emptying  L LBP  Pt's musculoskeletal assessment revealed uneven pelvic girdle and shoulder height, asymmetries to gait pattern, limited spinal /pelvic mobility. Plan to assess BLE strength, deep core coordination , balance, functional movement patterns required in her roles as a caregiver for multiple family members.  Pt is now caring for her husband who has frontal temporal behavioral dementia which requires her to pick up after him, clean him, do laundry 2 a day because he needs 2nd change of clothes. She can not leave him alone and she does not have a regular sitter or aide yet.  Pt also cares for her mom but she has not been able to get over there.  Pt also gets to see her grandson for 2 evenings a week 4-5 hours at her dtr house which also includes caring for husband during time.    Regional interdependent approaches will yield greater benefits in pt's POC due to leg length difference. Pt was provided new shoe lift in toe box and heel in L shoe today after which she showed improved gait speed and improved alignment of pelvis and spine.   Plan to address realignment of spine/ pelvis at next session to help promote optimize IAP system for improved pelvic floor function, trunk stability, gait, balance, stabilization with mobility tasks.  Plan to address pelvic floor issues once pelvis and spine are realigned to yield better outcomes.   Pt benefits from skilled PT.        OBJECTIVE IMPAIRMENTS decreased activity tolerance, decreased coordination, decreased endurance, decreased mobility, difficulty walking, decreased ROM, decreased strength, decreased safety awareness, hypomobility,  increased muscle spasms, impaired flexibility, improper body mechanics, postural dysfunction, and pain. scar restrictions   ACTIVITY LIMITATIONS  self-care, home chores, work tasks , caring for husband with dementia    PARTICIPATION LIMITATIONS:  community,  grandmother activities with 51 year old grandson,  socializing with friends    PERSONAL FACTORS        are also affecting patient's functional outcome.    REHAB POTENTIAL: Good   CLINICAL DECISION MAKING: Evolving/moderate complexity   EVALUATION COMPLEXITY: Moderate    PATIENT EDUCATION:    Education details: Showed pt anatomy images. Explained muscles attachments/ connection, physiology of deep core system/ spinal- thoracic-pelvis-lower kinetic chain as they relate to pt's presentation, Sx, and past Hx. Explained what and how these areas of deficits need to be restored to balance and function    See Therapeutic activity / neuromuscular re-education section  Answered pt's questions.   Person educated: Patient Education method: Explanation, Demonstration, Tactile cues, Verbal cues, and Handouts Education comprehension: verbalized understanding, returned demonstration, verbal cues required, tactile cues required, and needs further education     PLAN: PT FREQUENCY: 1x/week   PT DURATION: 10 weeks   PLANNED INTERVENTIONS: Therapeutic exercises, Therapeutic activity, Neuromuscular re-education, Balance training, Gait training, Patient/Family education, Self Care, Joint mobilization, Spinal mobilization, Moist heat, Taping, and Manual therapy, dry needling.   PLAN FOR NEXT SESSION: See clinical impression for plan     GOALS: Goals reviewed with patient? Yes  SHORT TERM GOALS: Target date: 05/26/2023    Pt will demo IND with HEP                    Baseline: Not IND            Goal status: INITIAL   LONG TERM GOALS: Target date: 07/07/2023    1.Pt will demo proper deep core coordination without chest breathing and  optimal excursion of diaphragm/pelvic floor in order  to promote spinal stability and pelvic floor function  Baseline: dyscoordination Goal status: INITIAL  2.  Pt will demo > 5 pt change on FOTO  to improve QOL and function  PFDI Urinary baseline -  8 Lower score = better function  Urinary Problem baseline-  59 Higher score = better function   Goal status: INITIAL  3.  Pt will demo proper body mechanics in against gravity tasks and ADLs  work tasks, fitness  to minimize straining pelvic floor / back    Baseline: not IND, improper form that places strain on pelvic floor  Goal status: INITIAL    4. Pt will demo increased gait speed > 1.5 m/s with reciprocal gait pattern, longer stride length  in order to ambulate safely in community and return to fitness routine  Baseline: 1.38 m/s, excessive sway to L,  Goal status: INITIAL    5.  Baseline: pt had leakage 3 x times last week and has leakage after peeing ( incomplete urination) and she has to return to the toilet 25% of the time  Goal status: INITIAL   6. Pt will demo levelled pelvic girdle and shoulder height in order to progress to deep core strengthening HEP and restore mobility at spine, pelvis, gait, posture minimize falls, and improve balance   Baseline: levelled pelvis, L shoulder lowered  (shoe lift in toe box/ heel in L shoe: levelled shoulder and pelvis)  Goal status: INITIAL     Mariane Masters, PT 04/28/2023, 9:04 AM

## 2023-04-30 ENCOUNTER — Encounter: Payer: Self-pay | Admitting: Gastroenterology

## 2023-04-30 ENCOUNTER — Ambulatory Visit: Payer: Medicare PPO | Admitting: Anesthesiology

## 2023-04-30 ENCOUNTER — Ambulatory Visit
Admission: RE | Admit: 2023-04-30 | Discharge: 2023-04-30 | Disposition: A | Discharge status: Home / Self Care | Payer: Medicare PPO | Attending: Gastroenterology | Admitting: Gastroenterology

## 2023-04-30 ENCOUNTER — Encounter
Admission: RE | Disposition: A | Discharge status: Home / Self Care | Payer: Self-pay | Source: Home / Self Care | Attending: Gastroenterology

## 2023-04-30 DIAGNOSIS — K625 Hemorrhage of anus and rectum: Secondary | ICD-10-CM | POA: Insufficient documentation

## 2023-04-30 DIAGNOSIS — Z539 Procedure and treatment not carried out, unspecified reason: Secondary | ICD-10-CM | POA: Diagnosis not present

## 2023-04-30 HISTORY — PX: COLONOSCOPY: SHX5424

## 2023-04-30 SURGERY — COLONOSCOPY
Anesthesia: General

## 2023-04-30 MED ORDER — SODIUM CHLORIDE 0.9 % IV SOLN: INTRAVENOUS | Status: DC

## 2023-04-30 NOTE — H&P (Signed)
Pt still with brown stools after prep. Will reschedule.  Enis Slipper, DO [facility]

## 2023-04-30 NOTE — Anesthesia Preprocedure Evaluation (Signed)
Anesthesia Evaluation  Patient identified by MRN, date of birth, ID band Patient awake    Reviewed: Allergy & Precautions, H&P , NPO status , Patient's Chart, lab work & pertinent test results  History of Anesthesia Complications Negative for: history of anesthetic complications  Airway Mallampati: II  TM Distance: >3 FB Neck ROM: full    Dental  (+) Dental Advidsory Given   Pulmonary neg pulmonary ROS   Pulmonary exam normal        Cardiovascular negative cardio ROS Normal cardiovascular exam     Neuro/Psych  PSYCHIATRIC DISORDERS  Depression    negative neurological ROS     GI/Hepatic Neg liver ROS, PUD,GERD  Medicated and Controlled,,  Endo/Other  Hypothyroidism    Renal/GU      Musculoskeletal  (+) Arthritis ,    Abdominal   Peds  Hematology negative hematology ROS (+)   Anesthesia Other Findings Past Medical History: 08/11/2016: Adult idiopathic generalized osteoporosis 04/22/2006: Basal cell carcinoma     Comment:  right med lower leg above ankle 06/29/2007: Basal cell carcinoma     Comment:  right chest mid parasternal 01/25/2014: Basal cell carcinoma     Comment:  right medial pretibial 08/30/2013: Bulging lumbar disc 08/30/2013: Cataract, bilateral 08/30/2013: Cervical disc disease 06/27/2019: Degeneration of lumbar intervertebral disc 07/26/2013: Dysplastic nevus     Comment:  right medial calf 05/22/2015: Erosive esophagitis No date: Family history of adverse reaction to anesthesia No date: GERD (gastroesophageal reflux disease) No date: Hx of basal cell carcinoma     Comment:  R nose txted in past 08/30/2013: Hyperlipidemia 1988: Liveborn infant by vaginal delivery     Comment:  female 29: Liveborn infant by vaginal delivery     Comment:  Female 03/14/2020: Major depressive disorder, recurrent, mild (HCC) 1980: Miscarriage 06/27/2019: Scoliosis deformity of spine  Past Surgical  History: No date: ABDOMINAL HYSTERECTOMY 10/06/2022: BREAST BIOPSY; Right     Comment:  rt stereo calcs, ribbon clip, path pending. 10/06/2022: BREAST BIOPSY; Right     Comment:  MM RT BREAST BX W LOC DEV 1ST LESION IMAGE BX SPEC               STEREO GUIDE 10/06/2022 [facility] 10/27/2022: BREAST BIOPSY; Right     Comment:  MM RT RADIO FREQUENCY TAG LOC MAMMO GUIDE 10/27/2022               [facility] 2012: BREAST EXCISIONAL BIOPSY; Left     Comment:  benign 11/2017: ROTATOR CUFF REPAIR No date: SHOULDER SURGERY  BMI    Body Mass Index: 26.52 kg/m      Reproductive/Obstetrics negative OB ROS                             Anesthesia Physical Anesthesia Plan  ASA: 2  Anesthesia Plan: General   Post-op Pain Management: Minimal or no pain anticipated   Induction: Intravenous  PONV Risk Score and Plan: 3 and Treatment may vary due to age or medical condition, Propofol infusion and TIVA  Airway Management Planned: Natural Airway and Nasal Cannula  Additional Equipment:   Intra-op Plan:   Post-operative Plan:   Informed Consent: I have reviewed the patients History and Physical, chart, labs and discussed the procedure including the risks, benefits and alternatives for the proposed anesthesia with the patient or authorized representative who has indicated his/her understanding and acceptance.     Dental Advisory Given  Plan Discussed with: Anesthesiologist,  CRNA and Surgeon  Anesthesia Plan Comments:         Anesthesia Quick Evaluation

## 2023-04-30 NOTE — OR Nursing (Signed)
Patient still having brown stools, will reschedule.

## 2023-05-06 ENCOUNTER — Ambulatory Visit: Payer: Medicare PPO | Admitting: Physical Therapy

## 2023-05-06 ENCOUNTER — Ambulatory Visit: Payer: Medicare PPO

## 2023-05-06 DIAGNOSIS — K621 Rectal polyp: Secondary | ICD-10-CM | POA: Diagnosis not present

## 2023-05-06 DIAGNOSIS — K648 Other hemorrhoids: Secondary | ICD-10-CM | POA: Diagnosis not present

## 2023-05-13 ENCOUNTER — Ambulatory Visit: Payer: Medicare PPO | Attending: Internal Medicine | Admitting: Physical Therapy

## 2023-05-13 DIAGNOSIS — R2689 Other abnormalities of gait and mobility: Secondary | ICD-10-CM | POA: Diagnosis present

## 2023-05-13 DIAGNOSIS — M5442 Lumbago with sciatica, left side: Secondary | ICD-10-CM | POA: Insufficient documentation

## 2023-05-13 DIAGNOSIS — M533 Sacrococcygeal disorders, not elsewhere classified: Secondary | ICD-10-CM | POA: Insufficient documentation

## 2023-05-13 DIAGNOSIS — M5431 Sciatica, right side: Secondary | ICD-10-CM | POA: Insufficient documentation

## 2023-05-13 DIAGNOSIS — M6208 Separation of muscle (nontraumatic), other site: Secondary | ICD-10-CM | POA: Insufficient documentation

## 2023-05-13 DIAGNOSIS — G8929 Other chronic pain: Secondary | ICD-10-CM | POA: Insufficient documentation

## 2023-05-13 NOTE — Therapy (Signed)
 OUTPATIENT PHYSICAL THERAPY TREATMENT    Patient Name: Krystal Mcintosh MRN: [id number] DOB:04-23-1955, 68 y.o., female Today's Date: 05/13/2023   PT End of Session - 05/13/23 1601     Visit Number 2    Number of Visits 10    Date for PT Re-Evaluation 07/07/23    PT Start Time 1550    PT Stop Time 1630    PT Time Calculation (min) 40 min    Activity Tolerance Patient tolerated treatment well;No increased pain    Behavior During Therapy [facility] for tasks assessed/performed             Past Medical History:  Diagnosis Date   Adult idiopathic generalized osteoporosis 08/11/2016   Basal cell carcinoma 04/22/2006   right med lower leg above ankle   Basal cell carcinoma 06/29/2007   right chest mid parasternal   Basal cell carcinoma 01/25/2014   right medial pretibial   Bulging lumbar disc 08/30/2013   Cataract, bilateral 08/30/2013   Cervical disc disease 08/30/2013   Degeneration of lumbar intervertebral disc 06/27/2019   Dysplastic nevus 07/26/2013   right medial calf   Erosive esophagitis 05/22/2015   Family history of adverse reaction to anesthesia    GERD (gastroesophageal reflux disease)    Hx of basal cell carcinoma    R nose txted in past   Hyperlipidemia 08/30/2013   Liveborn infant by vaginal delivery 23   female   Liveborn infant by vaginal delivery 1991   Female   Major depressive disorder, recurrent, 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   Miscarriage 1980   Scoliosis deformity of spine 06/27/2019   Past Surgical History:  Procedure Laterality Date   ABDOMINAL HYSTERECTOMY     BREAST BIOPSY Right 10/06/2022   rt stereo calcs, ribbon clip, path pending.   BREAST BIOPSY Right 10/06/2022   MM RT BREAST BX W LOC DEV 1ST LESION IMAGE BX SPEC STEREO GUIDE 10/06/2022 [facility]   BREAST BIOPSY Right 10/27/2022   MM RT RADIO FREQUENCY TAG LOC MAMMO GUIDE 10/27/2022 [facility]   BREAST BIOPSY WITH RADIO FREQUENCY LOCALIZER Right 10/29/2022   Procedure: BREAST BIOPSY  WITH RADIO FREQUENCY LOCALIZER;  Surgeon: Rodolph Romano, MD;  Location: [facility];  Service: General;  Laterality: Right;   BREAST EXCISIONAL BIOPSY Left 2012   benign   COLONOSCOPY N/A 04/30/2023   Procedure: COLONOSCOPY;  Surgeon: Onita Elspeth Sharper, DO;  Location: [facility];  Service: Gastroenterology;  Laterality: N/A;   ROTATOR CUFF REPAIR  11/2017   SHOULDER SURGERY     Patient Active Problem List   Diagnosis Date Noted   Atypical ductal hyperplasia of breast 11/05/2022   Family history of cancer 11/05/2022   Osteopenia 11/05/2022   Major depressive disorder, recurrent, 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   Degeneration of lumbar intervertebral disc 06/27/2019   Scoliosis deformity of spine 06/27/2019   Medicare annual wellness visit, initial 10/15/2018   Adult idiopathic generalized osteoporosis 08/11/2016   Hyperlipidemia, mixed 08/11/2016   Erosive esophagitis 05/22/2015   Cervical disc disease 08/30/2013    PCP: Cleotilde   REFERRING PROVIDER: Cleotilde MART DIAG: N39.41 (ICD-10-CM) - Urge incontinence   Rationale for Evaluation and Treatment Rehabilitation  THERAPY DIAG:  Sacrococcygeal disorders, not elsewhere classified  Chronic left-sided low back pain with left-sided sciatica  Other abnormalities of gait and mobility  Sciatica, right side  Diastasis recti  ONSET DATE:   SUBJECTIVE:           SUBJECTIVE STATEMENT TODAY:  LBP is alot better  for the past two weeks and it is at 2/10.           Pt has been cleaning her house more which does cause LBP   Urinary leakage is better but she still has go when she had to go.                                                                                                           SUBJECTIVE STATEMENT ON 04/28/23 EVAL :  1) urinary leakage:  pt had leakage 3 x times last week , two times when she was with friends, returning home. Pt is not sure  how it happened. She tried to get to the bathroom and get her pants down and it did not work.  2) incomplete emptying:  has leakage after peeing ( incomplete urination) and she has to return to the toilet 25% of the time  3) L LBP, with radiating pain to mid lateral thigh  8/10 , it was bad last week and she could not stand up from sitting, One week of 8/10 pain , three days , pain decreased to 3/10.  Pt iced and heated the area for 3 days. Used heating pads. Pt is not aware of what activities trigger the pain.  Since Nov 2024,  Pt has to bend when cleaning husband who has dementia and is incontinence. She sometimes tries to remember to bend her knees when wiping and cleaning her husband.  She had to clean him 4-5x  a day.   Pt has her shoe lift in toe box and heel of L shoe from past Pelvic PT sessions for her leg length difference but she has not replaced new ones.   PERTINENT HISTORY:  Pt is now caring for her husband who has frontal temporal behavioral dementia which requires her to pick up after him, clean him, do laundry 2 a day because he needs 2nd change of clothes. She can not leave him alone and she does not have a regular sitter or aide yet.  Pt also cares for her mom but she has not been able to get over there.  Pt also gets to see her grandson for 2 evenings a week 4-5 hours at her dtr house which also includes caring for husband during time.    PAIN:  Are you having pain? Yes: see above   PRECAUTIONS: no   WEIGHT BEARING RESTRICTIONS: no  FALLS:  Has patient fallen in last 6 months? No   LIVING ENVIRONMENT: Lives with: husband  Lives in: one story  Stairs: 3  STE with rail , 6-7 STE through garage    OCCUPATION: retired,   PLOF: IND   PATIENT GOALS:  Help her back .  Learn how to handle without panicking about leaking, review past Pelvic PT exercises    OBJECTIVE:    [facility] PT Assessment - 05/13/23 1609       Strength   Overall Strength Comments L hip abd 3/5, R  4/5  Palpation   SI assessment  levelled pelvis and shoulders      Ambulation/Gait   Gait Comments 1.65 m/s,  reciprocal gait pattern, longer stride length             [facility] Adult PT Treatment/Exercise - 05/13/23 1609       Self-Care   Self-Care Other Self-Care Comments    Other Self-Care Comments  explained the upcoming sessions to review deep core system, multidifis, hip abduction strengthening      Neuro Re-ed    Neuro Re-ed Details  cued for clam shells that co-activat feet and hip, cued for anterior tilt of pelvis in deep core HEP,  cued for hip abd stretch after clamshells               HOME EXERCISE PROGRAM: See pt instruction section    ASSESSMENT:  CLINICAL IMPRESSION:  Improvements across past sessions:   new shoe lift in toe box and heel in L shoe today after which she showed improved gait speed and improved alignment of pelvis and spine.   LBP is alot better  for the past two weeks and it is at 2/10.           Pt has been cleaning her house more which does cause LBP   Urinary leakage is better but she still has go when she had to go.            Gait speed increased with shoe lift in L shoe toe box and heel       Regional interdependent approaches will yield greater benefits in pt's POC due to leg length difference.  Added L hip abduction strengthening which was needed due to L weakness . Cued for coactivation of LKC and feet with hip abduction strengthening which pt demo'd correct form.    Advanced to deep core training which will promote optimize IAP system for improved pelvic floor function, trunk stability, gait, balance, stabilization with mobility tasks.    Pt benefits from skilled PT.        OBJECTIVE IMPAIRMENTS decreased activity tolerance, decreased coordination, decreased endurance, decreased mobility, difficulty walking, decreased ROM, decreased strength, decreased safety awareness, hypomobility, increased muscle spasms, impaired  flexibility, improper body mechanics, postural dysfunction, and pain. scar restrictions   ACTIVITY LIMITATIONS  self-care, home chores, work tasks , caring for husband with dementia    PARTICIPATION LIMITATIONS:  community,  grandmother activities with 28 year old grandson,  socializing with friends    PERSONAL FACTORS        are also affecting patient's functional outcome.    REHAB POTENTIAL: Good   CLINICAL DECISION MAKING: Evolving/moderate complexity   EVALUATION COMPLEXITY: Moderate    PATIENT EDUCATION:    Education details: Showed pt anatomy images. Explained muscles attachments/ connection, physiology of deep core system/ spinal- thoracic-pelvis-lower kinetic chain as they relate to pt's presentation, Sx, and past Hx. Explained what and how these areas of deficits need to be restored to balance and function    See Therapeutic activity / neuromuscular re-education section  Answered pt's questions.   Person educated: Patient Education method: Explanation, Demonstration, Tactile cues, Verbal cues, and Handouts Education comprehension: verbalized understanding, returned demonstration, verbal cues required, tactile cues required, and needs further education     PLAN: PT FREQUENCY: 1x/week   PT DURATION: 10 weeks   PLANNED INTERVENTIONS: Therapeutic exercises, Therapeutic activity, Neuromuscular re-education, Balance training, Gait training, Patient/Family education, Self Care, Joint mobilization, Spinal mobilization, Moist heat, Taping, and Manual  therapy, dry needling.   PLAN FOR NEXT SESSION: See clinical impression for plan     GOALS: Goals reviewed with patient? Yes  SHORT TERM GOALS: Target date: 05/26/2023    Pt will demo IND with HEP                    Baseline: Not IND            Goal status: INITIAL   LONG TERM GOALS: Target date: 07/07/2023    1.Pt will demo proper deep core coordination without chest breathing and optimal excursion of diaphragm/pelvic  floor in order to promote spinal stability and pelvic floor function  Baseline: dyscoordination Goal status: INITIAL  2.  Pt will demo > 5 pt change on FOTO  to improve QOL and function  PFDI Urinary baseline -  8 Lower score = better function  Urinary Problem baseline-  59 Higher score = better function   Goal status: DEFERRED   3.  Pt will demo proper body mechanics in against gravity tasks and ADLs  work tasks, fitness  to minimize straining pelvic floor / back    Baseline: not IND, improper form that places strain on pelvic floor  Goal status: INITIAL    4. Pt will demo increased gait speed > 1.5 m/s with reciprocal gait pattern, longer stride length  in order to ambulate safely in community and return to fitness routine  Baseline: 1.38 m/s, excessive sway to L,  Goal status: MET ( 05/13/23: 1.65 m/s,  reciprocal gait pattern, longer stride length )     5.  Baseline: pt had leakage 3 x times last week and has leakage after peeing ( incomplete urination) and she has to return to the toilet 25% of the time  Goal status: INITIAL   6. Pt will demo levelled pelvic girdle and shoulder height in order to progress to deep core strengthening HEP and restore mobility at spine, pelvis, gait, posture minimize falls, and improve balance   Baseline: levelled pelvis, L shoulder lowered  (shoe lift in toe box/ heel in L shoe: levelled shoulder and pelvis)  Goal status: INITIAL     Pia Lupe Plump, PT 05/13/2023, 4:02 PM

## 2023-05-13 NOTE — Patient Instructions (Signed)
   Clam Shell 45 Degrees  Lying with hips and knees bent 45, one pillow between knees and ankles. Heel together, toes apart like ballerina,  Lift knee with exhale while pressing heels together. Be sure pelvis does not roll backward. Do not arch back. Do 30 times, L   ,  2 times per day.    Complimentary stretch:   Sit at 45 deg turn with R leg and knee on edge of chair/ bench, L buttock hanging off the edge to bring the L foot back like a lunge, toes bent, lower heel to feel quad stretch,  pay attention to keeping pinky and first toe ballmound planted to align toes forward so ankles are not twerked   Repeat with other side   ___  Deep core level 1 -2 ( handout)

## 2023-05-22 ENCOUNTER — Ambulatory Visit: Payer: Medicare PPO | Admitting: Physical Therapy

## 2023-05-22 DIAGNOSIS — G8929 Other chronic pain: Secondary | ICD-10-CM

## 2023-05-22 DIAGNOSIS — M533 Sacrococcygeal disorders, not elsewhere classified: Secondary | ICD-10-CM

## 2023-05-22 NOTE — Therapy (Signed)
OUTPATIENT PHYSICAL THERAPY TREATMENT    Patient Name: Krystal Mcintosh MRN: [id number] DOB:04-18-1955, 68 y.o., female Today's Date: 05/22/2023   PT End of Session - 05/22/23 1054     Visit Number 3    Number of Visits 10    Date for PT Re-Evaluation 07/07/23    PT Start Time 1051    PT Stop Time 1130    PT Time Calculation (min) 39 min    Activity Tolerance Patient tolerated treatment well;No increased pain    Behavior During Therapy [facility] for tasks assessed/performed             Past Medical History:  Diagnosis Date   Adult idiopathic generalized osteoporosis 08/11/2016   Basal cell carcinoma 04/22/2006   right med lower leg above ankle   Basal cell carcinoma 06/29/2007   right chest mid parasternal   Basal cell carcinoma 01/25/2014   right medial pretibial   Bulging lumbar disc 08/30/2013   Cataract, bilateral 08/30/2013   Cervical disc disease 08/30/2013   Degeneration of lumbar intervertebral disc 06/27/2019   Dysplastic nevus 07/26/2013   right medial calf   Erosive esophagitis 05/22/2015   Family history of adverse reaction to anesthesia    GERD (gastroesophageal reflux disease)    Hx of basal cell carcinoma    R nose txted in past   Hyperlipidemia 08/30/2013   Liveborn infant by vaginal delivery 3   female   Liveborn infant by vaginal delivery 1991   Female   Major depressive disorder, recurrent, 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   Miscarriage 1980   Scoliosis deformity of spine 06/27/2019   Past Surgical History:  Procedure Laterality Date   ABDOMINAL HYSTERECTOMY     BREAST BIOPSY Right 10/06/2022   rt stereo calcs, ribbon clip, path pending.   BREAST BIOPSY Right 10/06/2022   MM RT BREAST BX W LOC DEV 1ST LESION IMAGE BX SPEC STEREO GUIDE 10/06/2022 [facility]   BREAST BIOPSY Right 10/27/2022   MM RT RADIO FREQUENCY TAG LOC MAMMO GUIDE 10/27/2022 [facility]   BREAST BIOPSY WITH RADIO FREQUENCY LOCALIZER Right 10/29/2022   Procedure: BREAST BIOPSY  WITH RADIO FREQUENCY LOCALIZER;  Surgeon: Carolan Shiver, MD;  Location: [facility];  Service: General;  Laterality: Right;   BREAST EXCISIONAL BIOPSY Left 2012   benign   COLONOSCOPY N/A 04/30/2023   Procedure: COLONOSCOPY;  Surgeon: Jaynie Collins, DO;  Location: [facility];  Service: Gastroenterology;  Laterality: N/A;   ROTATOR CUFF REPAIR  11/2017   SHOULDER SURGERY     Patient Active Problem List   Diagnosis Date Noted   Atypical ductal hyperplasia of breast 11/05/2022   Family history of cancer 11/05/2022   Osteopenia 11/05/2022   Major depressive disorder, recurrent, 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   Degeneration of lumbar intervertebral disc 06/27/2019   Scoliosis deformity of spine 06/27/2019   Medicare annual wellness visit, initial 10/15/2018   Adult idiopathic generalized osteoporosis 08/11/2016   Hyperlipidemia, mixed 08/11/2016   Erosive esophagitis 05/22/2015   Cervical disc disease 08/30/2013    PCP: Hyacinth Meeker   REFERRING PROVIDER: Philippa Chester DIAG: N39.41 (ICD-10-CM) - Urge incontinence   Rationale for Evaluation and Treatment Rehabilitation  THERAPY DIAG:  Sacrococcygeal disorders, not elsewhere classified  Chronic left-sided low back pain with left-sided sciatica  ONSET DATE:   SUBJECTIVE:           SUBJECTIVE STATEMENT TODAY:  Pt had only had one incident of leakage last week.  Pt brought her shoes and had questions about inserting her shoe lifts that arrived.                                                                                                        SUBJECTIVE STATEMENT ON 04/28/23 EVAL :  1) urinary leakage:  pt had leakage 3 x times last week , two times when she was with friends, returning home. Pt is not sure how it happened. She tried to get to the bathroom and get her pants down and it did not work.  2) incomplete emptying:  has leakage after peeing (  incomplete urination) and she has to return to the toilet 25% of the time  3) L LBP, with radiating pain to mid lateral thigh  8/10 , it was bad last week and she could not stand up from sitting, One week of 8/10 pain , three days , pain decreased to 3/10.  Pt iced and heated the area for 3 days. Used heating pads. Pt is not aware of what activities trigger the pain.  Since Nov 2024,  Pt has to bend when cleaning husband who has dementia and is incontinence. She sometimes tries to remember to bend her knees when wiping and cleaning her husband.  She had to clean him 4-5x  a day.   Pt has her shoe lift in toe box and heel of L shoe from past Pelvic PT sessions for her leg length difference but she has not replaced new ones.   PERTINENT HISTORY:  Pt is now caring for her husband who has frontal temporal behavioral dementia which requires her to pick up after him, clean him, do laundry 2 a day because he needs 2nd change of clothes. She can not leave him alone and she does not have a regular sitter or aide yet.  Pt also cares for her mom but she has not been able to get over there.  Pt also gets to see her grandson for 2 evenings a week 4-5 hours at her dtr house which also includes caring for husband during time.    PAIN:  Are you having pain? Yes: see above   PRECAUTIONS: no   WEIGHT BEARING RESTRICTIONS: no  FALLS:  Has patient fallen in last 6 months? No   LIVING ENVIRONMENT: Lives with: husband  Lives in: one story  Stairs: 3  STE with rail , 6-7 STE through garage    OCCUPATION: retired,   PLOF: IND   PATIENT GOALS:  Help her back .  Learn how to handle without panicking about leaking, review past Pelvic PT exercises    OBJECTIVE:     Pelvic Floor Special Questions - 05/22/23 1258     Pelvic Floor Internal Exam pt consented verbally without contraindications    Exam Type Vaginal    Palpation tightness at obt int B, posterior mm L > R ,    Strength weak squeeze, no lift    post Tx: lift elicitied and upward  movement            [facility] Adult PT Treatment/Exercise - 05/22/23 1259       Therapeutic Activites    Other Therapeutic Activities withheld kegels due to tightness of pelvic floor, fitted pt's shoes with shoe lifts which she had ordered      Neuro Re-ed    Neuro Re-ed Details  cued for pelvic floor coordination to minmize dyscoordination, cued for co-activaiton of feet with pelvic floor and deep core  ,      Manual Therapy   Manual therapy comments internal STMMWM to promote lengthening of pelvic floor and neuro training for coordination                  HOME EXERCISE PROGRAM: See pt instruction section    ASSESSMENT:  CLINICAL IMPRESSION:  Improvements across past sessions:   new shoe lift in toe box and heel in L shoe today after which she showed improved gait speed and improved alignment of pelvis and spine.   LBP is alot better  for the past two weeks and it is at 2/10.           Pt has been cleaning her house more which does cause LBP   Urinary leakage is better but she still has go when she had to go.            Gait speed increased with shoe lift in L shoe toe box and heel    Internal pelvic floor assessment showed L tightness of muscle and dyscoordination of pelvic floor.    Post Tx, pt showed improved mobility of pelvic floor and coordination.   Cued for coactivation of LKC and feet . Withholding kegels due to overactivity.     Regional interdependent approaches will yield greater benefits in pt's POC due to leg length difference.   Advanced to deep core training which will promote optimize IAP system for improved pelvic floor function, trunk stability, gait, balance, stabilization with mobility tasks.    Pt benefits from skilled PT.        OBJECTIVE IMPAIRMENTS decreased activity tolerance, decreased coordination, decreased endurance, decreased mobility, difficulty walking, decreased ROM, decreased strength,  decreased safety awareness, hypomobility, increased muscle spasms, impaired flexibility, improper body mechanics, postural dysfunction, and pain. scar restrictions   ACTIVITY LIMITATIONS  self-care, home chores, work tasks , caring for husband with dementia    PARTICIPATION LIMITATIONS:  community,  grandmother activities with 19 year old grandson,  socializing with friends    PERSONAL FACTORS        are also affecting patient's functional outcome.    REHAB POTENTIAL: Good   CLINICAL DECISION MAKING: Evolving/moderate complexity   EVALUATION COMPLEXITY: Moderate    PATIENT EDUCATION:    Education details: Showed pt anatomy images. Explained muscles attachments/ connection, physiology of deep core system/ spinal- thoracic-pelvis-lower kinetic chain as they relate to pt's presentation, Sx, and past Hx. Explained what and how these areas of deficits need to be restored to balance and function    See Therapeutic activity / neuromuscular re-education section  Answered pt's questions.   Person educated: Patient Education method: Explanation, Demonstration, Tactile cues, Verbal cues, and Handouts Education comprehension: verbalized understanding, returned demonstration, verbal cues required, tactile cues required, and needs further education     PLAN: PT FREQUENCY: 1x/week   PT DURATION: 10 weeks   PLANNED INTERVENTIONS: Therapeutic exercises, Therapeutic activity, Neuromuscular re-education, Balance training, Gait training, Patient/Family education, Self Care, Joint mobilization, Spinal mobilization,  Moist heat, Taping, and Manual therapy, dry needling.   PLAN FOR NEXT SESSION: See clinical impression for plan     GOALS: Goals reviewed with patient? Yes  SHORT TERM GOALS: Target date: 05/26/2023    Pt will demo IND with HEP                    Baseline: Not IND            Goal status: INITIAL   LONG TERM GOALS: Target date: 07/07/2023    1.Pt will demo proper deep core  coordination without chest breathing and optimal excursion of diaphragm/pelvic floor in order to promote spinal stability and pelvic floor function  Baseline: dyscoordination Goal status: INITIAL  2.  Pt will demo > 5 pt change on FOTO  to improve QOL and function  PFDI Urinary baseline -  8 Lower score = better function  Urinary Problem baseline-  59 Higher score = better function   Goal status: DEFERRED   3.  Pt will demo proper body mechanics in against gravity tasks and ADLs  work tasks, fitness  to minimize straining pelvic floor / back    Baseline: not IND, improper form that places strain on pelvic floor  Goal status: INITIAL    4. Pt will demo increased gait speed > 1.5 m/s with reciprocal gait pattern, longer stride length  in order to ambulate safely in community and return to fitness routine  Baseline: 1.38 m/s, excessive sway to L,  Goal status: MET ( 05/13/23: 1.65 m/s,  reciprocal gait pattern, longer stride length )     5.  Baseline: pt had leakage 3 x times last week and has leakage after peeing ( incomplete urination) and she has to return to the toilet 25% of the time  Goal status: INITIAL   6. Pt will demo levelled pelvic girdle and shoulder height in order to progress to deep core strengthening HEP and restore mobility at spine, pelvis, gait, posture minimize falls, and improve balance   Baseline: levelled pelvis, L shoulder lowered  (shoe lift in toe box/ heel in L shoe: levelled shoulder and pelvis)  Goal status: INITIAL     Mariane Masters, PT 05/22/2023, 10:55 AM

## 2023-05-25 ENCOUNTER — Ambulatory Visit: Payer: Medicare PPO | Admitting: Physical Therapy

## 2023-06-02 NOTE — Progress Notes (Signed)
 Medicare Wellness Visit   Providers Rendering Care Dr. Oneil Miller-Internal Medicine  Functional Assessment (1) Hearing: Demonstrates no difficulty in hearing during normal conversation (2) Risk of Falls: Patient denies any falls or near falls in the last year (3) Home Safety: Patient feels secure in their home. There are operational smoke alarms in multiple areas of the home. (4) Activities of Daily Living: Independently manages personal grooming and household chores, including cooking, cleaning and laundry.  Manages Personal finances without assistance.    Depression Screening PHQ 2/9 last 3 flowsheet values     11/11/2021    9:22 AM 05/15/2022    9:20 AM 06/02/2023   11:41 AM  PHQ-2/9 Depression Screening   Little interest or pleasure in doing things   0  Feeling down, depressed, or hopeless   0  Patient Health Questionnaire-2 Score   0  (OBSOLETE) Little interest or pleasure in doing things 0 0   (OBSOLETE) Feeling down, depressed, or hopeless (or irritable for Teens only)? 0 0   (OBSOLETE) Total Prescreening Score 0 0   (OBSOLETE) Total Score = 0 0      Depression Severity and Treatment Recommendations:  0-4= None  5-9= Mild / Treatment: Support, educate to call if worse; return in one month  10-14= Moderate / Treatment: Support, watchful waiting; Antidepressant or Psychotherapy  15-19= Moderately severe / Treatment: Antidepressant OR Psychotherapy  >= 20 = Major depression, severe / Antidepressant AND Psychotherapy   Cognitive impairment Oriented to person, place and time.  Responses appear appropriate and timely to this observer.   Prevention Plan  Item name                              Frequency        Month Due       Year Due Health Maintenance  Topic Date Due  . Hepatitis C Screen  Never done  . Pneumococcal Vaccine: 50+ (2 of 2 - PCV) 10/22/2019  . COVID-19 Vaccine (6 - 2024-25 season) 12/07/2022  . Mammogram  09/15/2023  . TSH Level  05/28/2024  .  Depression Screening  06/01/2024  . Medicare Initial or AWV  06/01/2024  . DXA Bone Density Scan  11/13/2027  . Colorectal Cancer Screening  05/05/2028  . Lipid Panel  05/28/2028  . RSV Immunization Pregnant or 60+ (1 - 1-dose 75+ series) 07/06/2028  . Adult Tetanus (Td And Tdap)  03/14/2030  . Shingrix  Completed  . Influenza Vaccine  Completed  . Hib Vaccines  Aged Out  . Hepatitis A Vaccines  Aged Out  . Meningococcal B Vaccine  Aged Out  . Meningococcal ACWY Vaccine  Aged Out  . HPV Vaccines  Aged Out    Other personalized health advice Encouraged patient to exercise regularly.  Encouraged attention to diet with good intake of fruits, vegetables, and limitation of red meat to 2 times a week or less    End of Life Counseling Patient has a living will in place.  Patient is a full code.

## 2023-06-02 NOTE — Progress Notes (Signed)
 Patient Profile:   Krystal Mcintosh  is a 68 y.o.  female Chief Complaint  Patient presents with  . Follow-up      PROBLEM LIST: Past Medical History:  Diagnosis Date  . Atypical ductal hyperplasia of right breast 01/20/2023    2024, no Arimidex   . Bulging lumbar disc   . Cervical disc disease 08/30/2013  . Erosive esophagitis   . GERD (gastroesophageal reflux disease)    not sure- Dr. Cleotilde started me on Nexium  . Hyperlipidemia 08/30/2013  . Major depressive disorder, recurrent, mild (CMS-HCC) 03/14/2020  . Osteoporosis 08/30/2013  . Other age-related incipient cataract, bilateral     Past Surgical History:  Procedure Laterality Date  . REPAIR ROTATOR CUFF TEAR ACUTE OPEN  11/2017  . BREAST EXCISIONAL BIOPSY Right 11/13/2022   Dr Lucas Catchings ---w RF  . Colon @ [facility]  05/06/2023   Normal colon biopsy/FHx CP/Repeat 79yrs/SMR  . HERNIA REPAIR     umbilical  . HYSTERECTOMY      ALLERGIES: No Known Allergies  CURRENT MEDICATIONS: Current Outpatient Medications  Medication Sig Dispense Refill  . acetaminophen  (TYLENOL ) 325 MG tablet Take 325 mg by mouth    . amitriptyline (ELAVIL) 10 MG tablet Take 1 tablet (10 mg total) by mouth at bedtime 30 tablet 1  . calcium carbonate-vitamin D3 (OS-CAL 500+D) 500 mg(1,250mg ) -400 unit tablet Take 1 tablet by mouth 3 (three) times daily.      . Herbal Supplement Herbal Name: Unisom/B12    . levothyroxine (SYNTHROID) 50 MCG tablet Take 1 tablet (50 mcg total) by mouth once daily Take on an empty stomach with a glass of water  at least 30-60 minutes before breakfast. 90 tablet 3  . methocarbamoL (ROBAXIN) 500 MG tablet Take 1 tablet (500 mg total) by mouth 3 (three) times daily as needed (muscle) for up to 90 days 40 tablet 1  . multivitamin tablet Take 1 tablet by mouth once daily.    SABRA omeprazole (PRILOSEC) 40 MG DR capsule take one capsule by mouth once daily 90 capsule 3  .  polyethylene glycol (MIRALAX) powder Take 17 g by mouth once daily    . propranoloL (INDERAL LA) 60 MG LA capsule Take 1 capsule (60 mg total) by mouth once daily 90 capsule 3  . simvastatin (ZOCOR) 20 MG tablet Take 1 tablet (20 mg total) by mouth at bedtime 90 tablet 3  . temazepam (RESTORIL) 15 mg capsule Take 1 capsule (15 mg total) by mouth at bedtime as needed for Sleep 90 capsule 1  . venlafaxine (EFFEXOR-XR) 75 MG XR capsule Take 1 capsule (75 mg total) by mouth once daily 90 capsule 3   No current facility-administered medications for this visit.      HPI   CLINICAL SUMMARY:  Patient under a lot of stress with her husband with progressive dementia and her brother with metastatic thymic cancer.  Has left neck pain.  Continues on the Effexor.  Under a lot of stress, moving to a condo  ROS: Review of systems is unremarkable for any active cardiac, respiratory, GI, GU, hematologic, neurologic, dermatologic, HEENT, or psychiatric symptoms except as noted above, 10 systems reviewed.  No fevers, chills, or constitutional symptoms.   PHYSICAL EXAM  Vital signs:  BP 120/80   Pulse 60   Wt 66.6 kg (146 lb  12.8 oz)   SpO2 97%   BMI 27.74 kg/m  Body mass index is 27.74 kg/m.   Wt Readings from Last 3 Encounters:  06/02/23 66.6 kg (146 lb 12.8 oz)  03/17/23 68 kg (150 lb)  02/23/23 67.3 kg (148 lb 6.4 oz)     BP Readings from Last 3 Encounters:  06/02/23 120/80  03/17/23 123/76  02/23/23 124/82    Constitutional:NAD Neck: supple, no thyromegaly, good ROM Respiratory:clear to auscultation, no rales or wheezes Cardiovascular:RRR, no murmur or gallop Abdominal:soft, good BS, NT Ext: no edema, good peripheral pulses Neuro: alert and oriented X 3, grossly nonfocal     ASSESSMENT/PLAN   Cervicalgia-a lot of stress, trigger point shot given left posterior cervical area Mild depression-on Effexor, lot of stress from family members, moving to a condo, husband with progressive  Lewy body dementia Hyperlipidemia-numbers reasonable on Zocor Tremor-Inderal  Dispo:   Return in about 6 months (around 11/30/2023) for physical.

## 2023-06-04 ENCOUNTER — Ambulatory Visit: Payer: Medicare PPO | Admitting: Physical Therapy

## 2023-06-11 ENCOUNTER — Ambulatory Visit: Payer: Medicare PPO | Attending: Internal Medicine | Admitting: Physical Therapy

## 2023-07-02 ENCOUNTER — Encounter: Payer: Medicare PPO | Admitting: Physical Therapy

## 2023-07-07 ENCOUNTER — Encounter: Payer: Medicare PPO | Admitting: Physical Therapy

## 2023-07-14 ENCOUNTER — Encounter: Payer: Medicare PPO | Admitting: Physical Therapy

## 2023-07-21 ENCOUNTER — Encounter: Payer: Medicare PPO | Admitting: Physical Therapy

## 2023-07-28 ENCOUNTER — Encounter: Payer: Medicare PPO | Admitting: Physical Therapy

## 2023-08-03 IMAGING — MG DIGITAL DIAGNOSTIC BILAT W/ TOMO W/ CAD
6 of 10 series · 6 of 30 positions shown · non-contrast
Comparison: Previous exam(s).

CLINICAL DATA: RIGHT palpable area for 3 weeks.

EXAM:
DIGITAL DIAGNOSTIC BILATERAL MAMMOGRAM WITH TOMOSYNTHESIS AND CAD;
ULTRASOUND RIGHT BREAST LIMITED
TECHNIQUE: Bilateral digital diagnostic mammography and breast tomosynthesis
was performed. The images were evaluated with computer-aided
detection.; Targeted ultrasound examination of the right breast was
performed

[R TAN synth-2D]
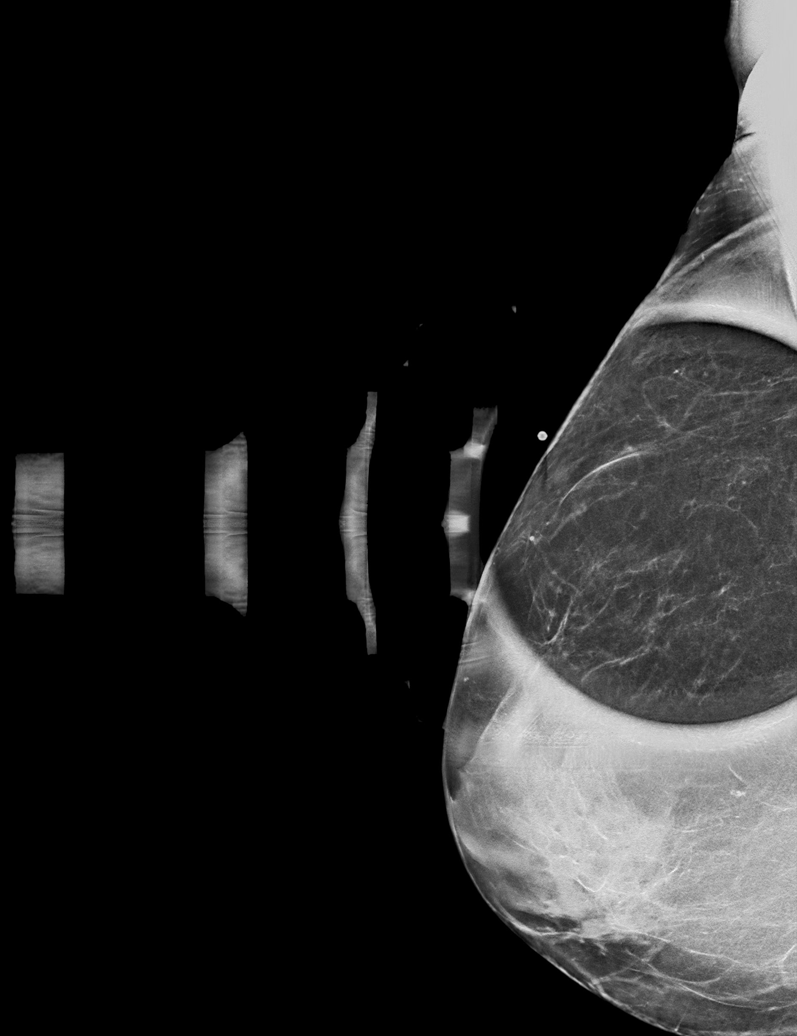

[L MLO synth-2D]
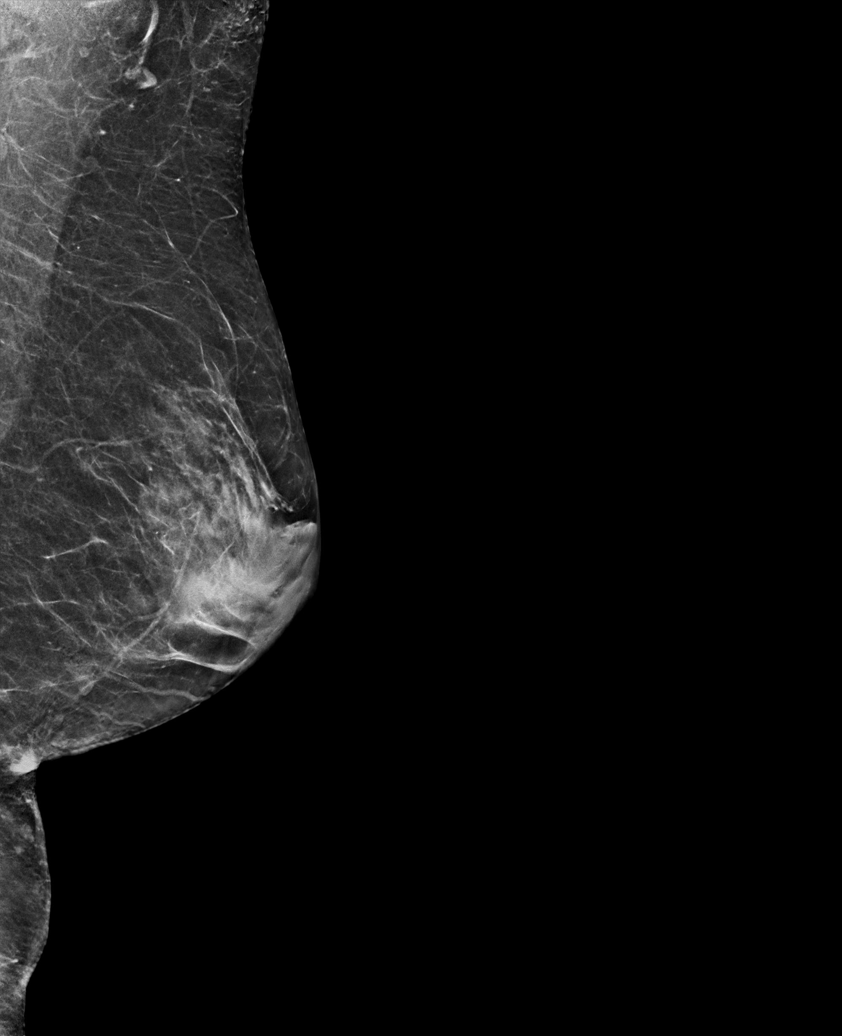

[R CC synth-2D]
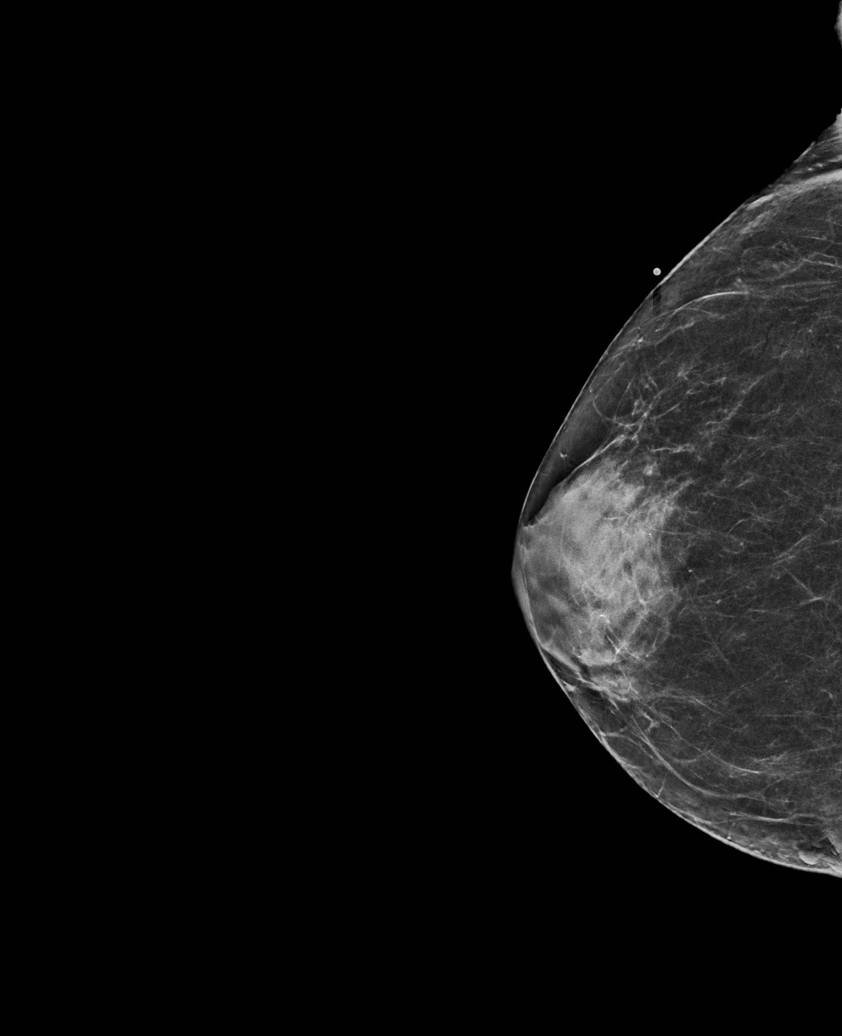

[R MLO synth-2D]
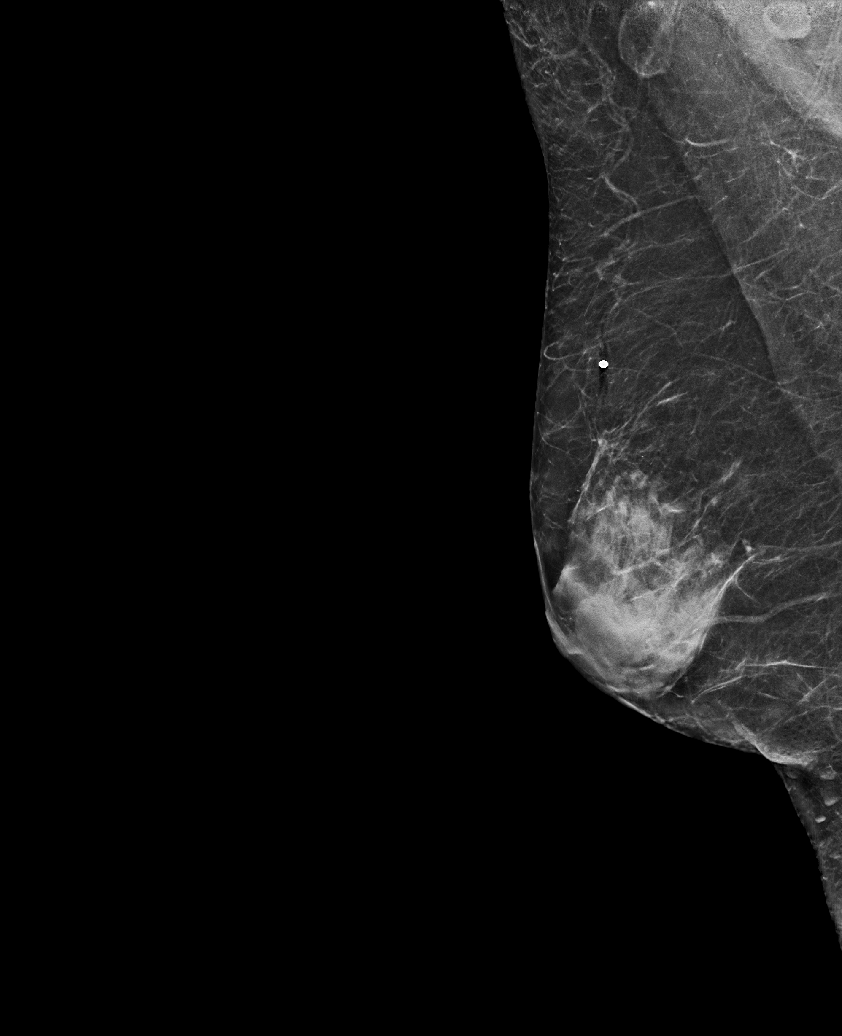

[L CC synth-2D]
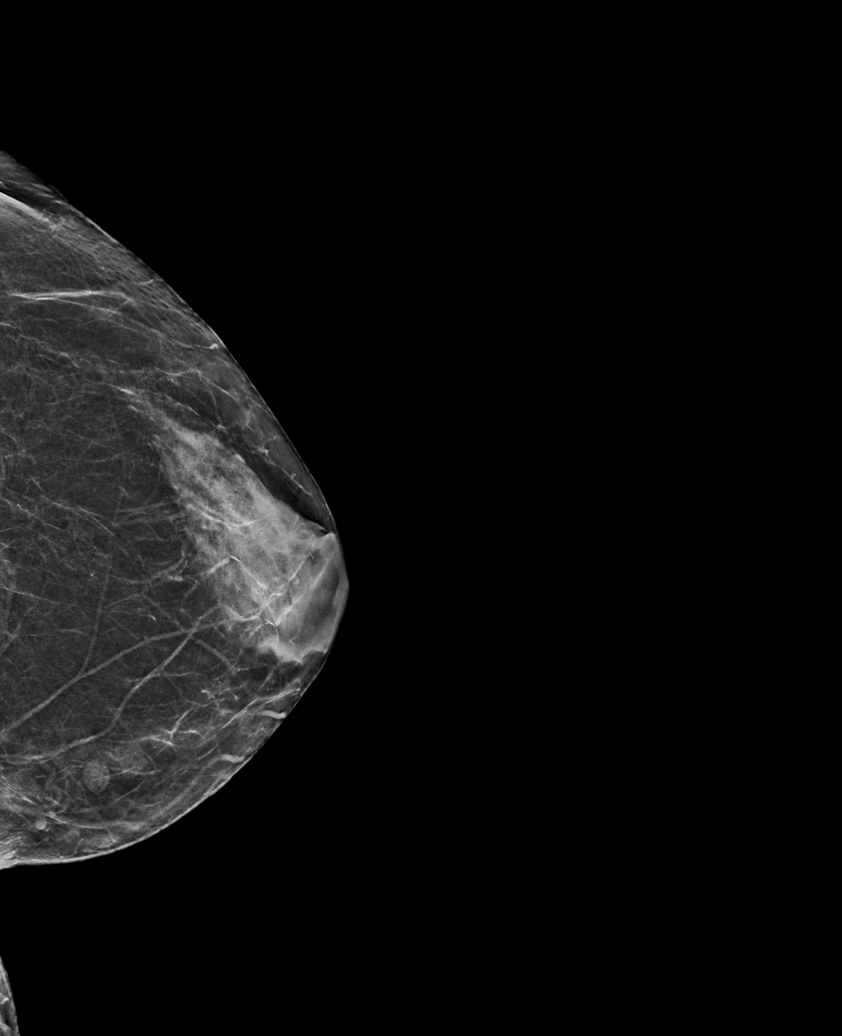

[R TAN tomo · tomo slice 29/56.0]
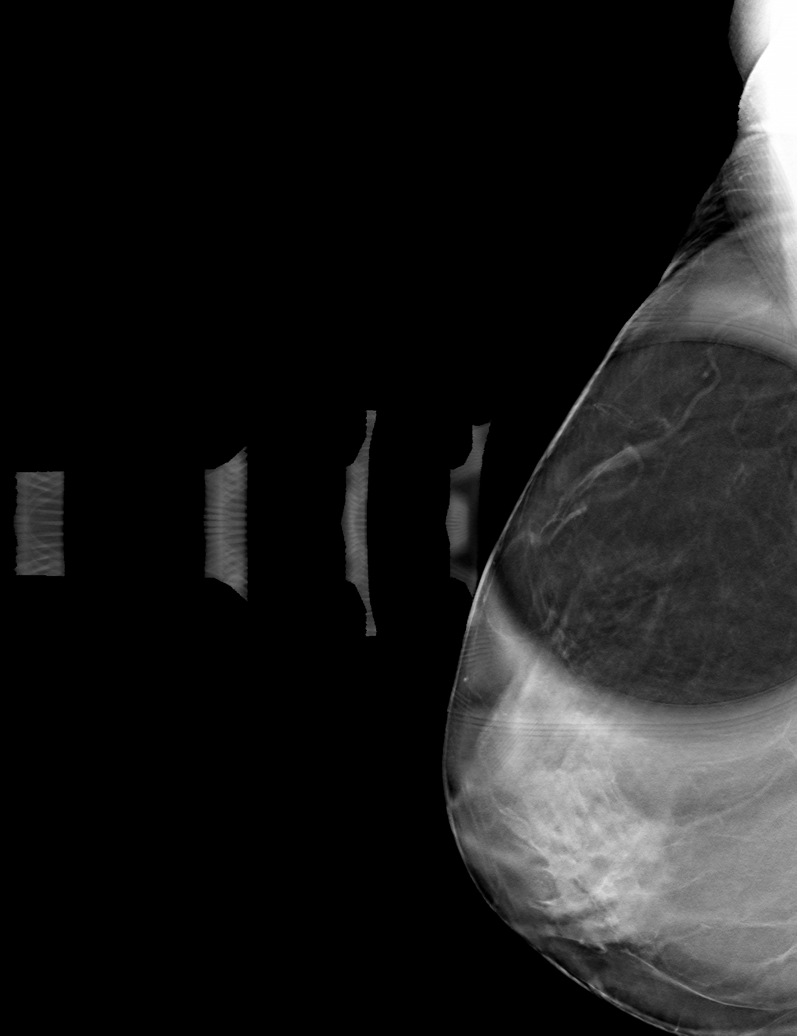

[6 of 30 positions shown; findings below may reference images not displayed]

ACR Breast Density Category c: The breast tissue is heterogeneously
dense, which may obscure small masses.
FINDINGS: Spot compression tomosynthesis views were obtained over the palpable
area of concern in the RIGHT breast. No suspicious mammographic
finding is identified in this area. There is fat density in this
area on spot tangential imaging. No suspicious mass,
microcalcification, or other finding is identified in either breast.

On physical exam, there is a soft mobile ridge appreciated at the
site of palpable concern

Targeted RIGHT breast ultrasound was performed in the palpable area
of concern at the upper outer breast. No suspicious solid or cystic
mass is identified. A prominent fat lobule is noted at the palpable
concern.
IMPRESSION: 1. No mammographic or sonographic evidence of malignancy at the site
of palpable concern. There is a prominent fat lobule at the site of
palpable concern. Any further workup of the patient's symptoms
should be based on the clinical assessment. Recommend routine annual
screening mammogram in 1 year.
2. No mammographic evidence of malignancy bilaterally.

RECOMMENDATION:
Screening mammogram in one year.(Code:G8-K-JJH)

I have discussed the findings and recommendations with the patient.
If applicable, a reminder letter will be sent to the patient
regarding the next appointment.

BI-RADS CATEGORY  1: Negative.

## 2023-08-03 IMAGING — US US BREAST*R* LIMITED INC AXILLA
1 series · 6 of 6 positions shown · non-contrast
Comparison: Previous exam(s).

CLINICAL DATA: RIGHT palpable area for 3 weeks.

EXAM:
DIGITAL DIAGNOSTIC BILATERAL MAMMOGRAM WITH TOMOSYNTHESIS AND CAD;
ULTRASOUND RIGHT BREAST LIMITED
TECHNIQUE: Bilateral digital diagnostic mammography and breast tomosynthesis
was performed. The images were evaluated with computer-aided
detection.; Targeted ultrasound examination of the right breast was
performed

[Series 1: us breast*right* limited inc axilla · 0.07mm/px · 6 of 6 slices shown]
[im 1/6]
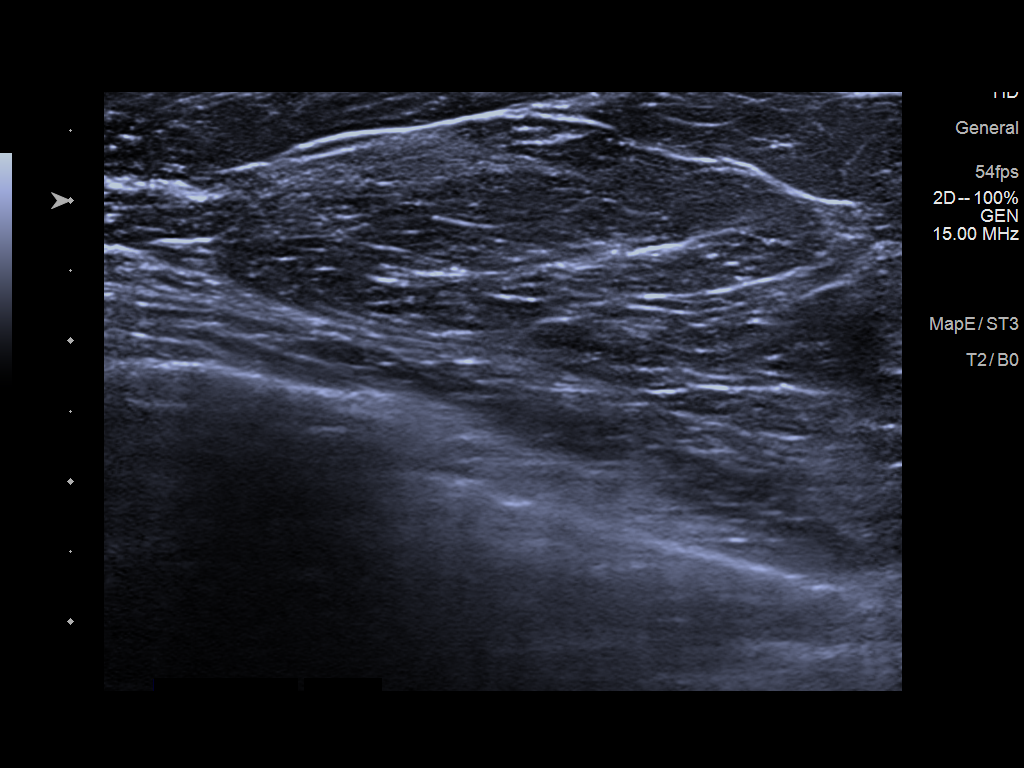
[im 2/6]
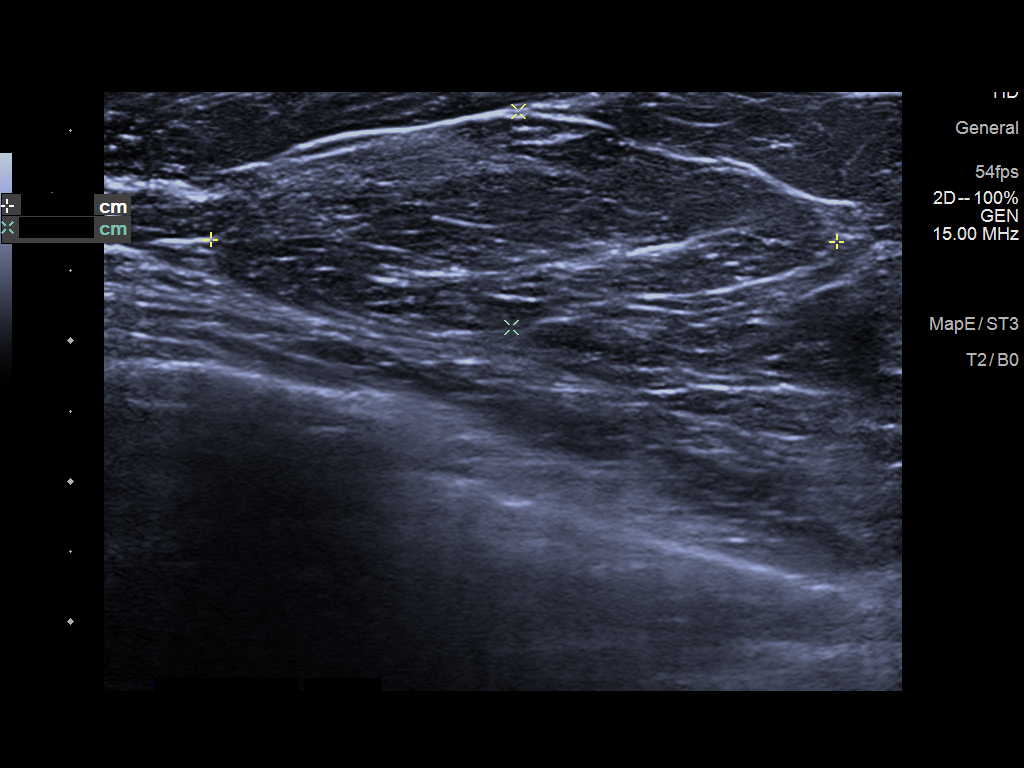
[im 3/6]
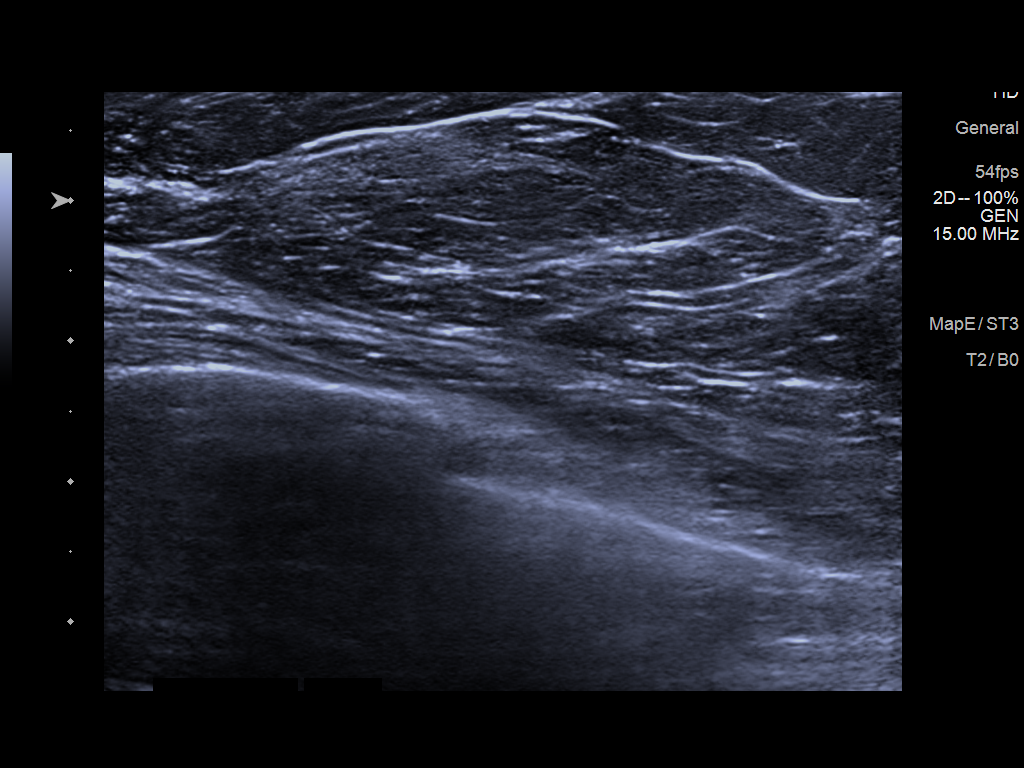
[im 4/6]
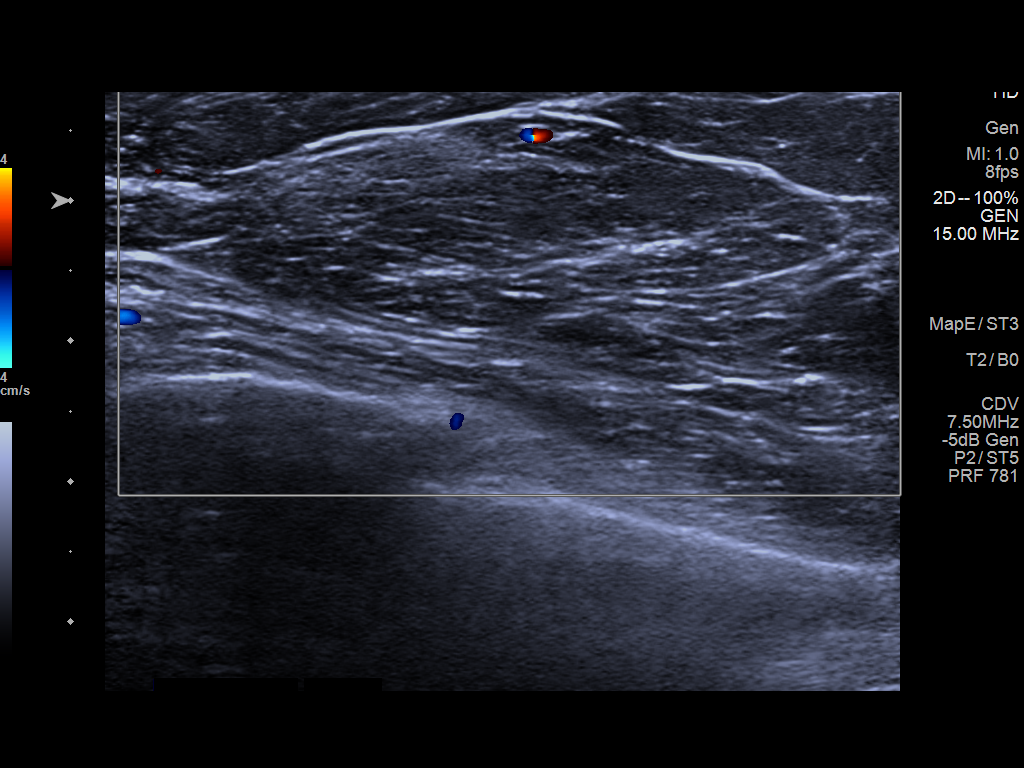
[im 5/6]
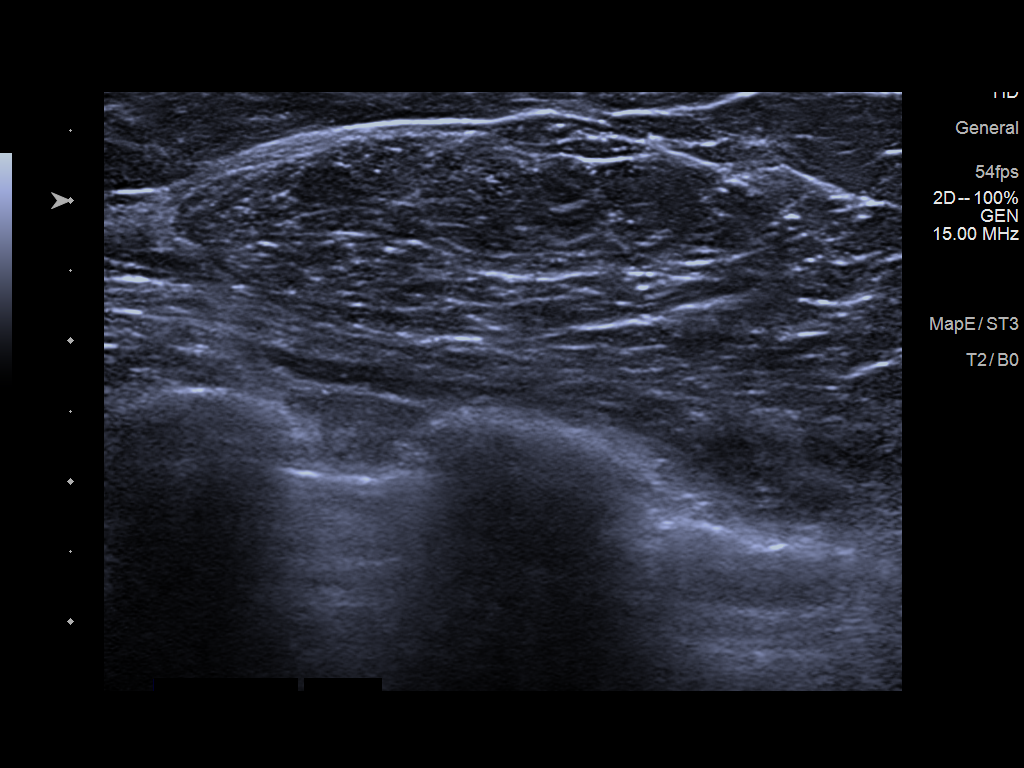
[im 6/6]
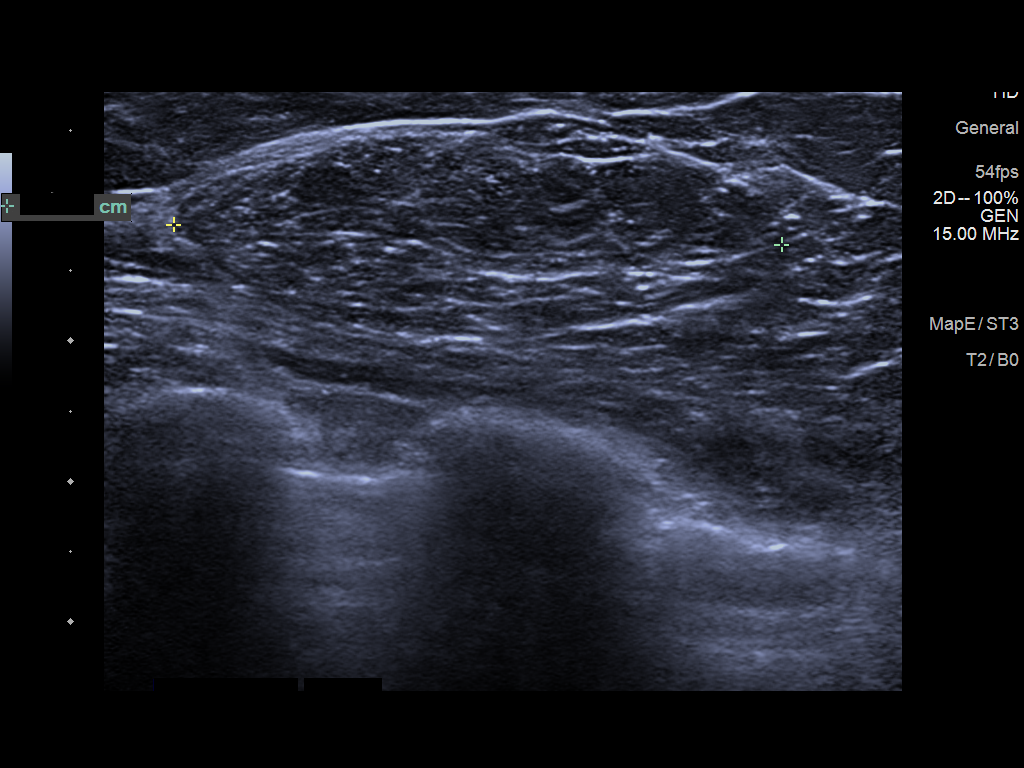

[6 of 6 positions shown; findings below may reference images not displayed]

ACR Breast Density Category c: The breast tissue is heterogeneously
dense, which may obscure small masses.
FINDINGS: Spot compression tomosynthesis views were obtained over the palpable
area of concern in the RIGHT breast. No suspicious mammographic
finding is identified in this area. There is fat density in this
area on spot tangential imaging. No suspicious mass,
microcalcification, or other finding is identified in either breast.

On physical exam, there is a soft mobile ridge appreciated at the
site of palpable concern

Targeted RIGHT breast ultrasound was performed in the palpable area
of concern at the upper outer breast. No suspicious solid or cystic
mass is identified. A prominent fat lobule is noted at the palpable
concern.
IMPRESSION: 1. No mammographic or sonographic evidence of malignancy at the site
of palpable concern. There is a prominent fat lobule at the site of
palpable concern. Any further workup of the patient's symptoms
should be based on the clinical assessment. Recommend routine annual
screening mammogram in 1 year.
2. No mammographic evidence of malignancy bilaterally.

RECOMMENDATION:
Screening mammogram in one year.(Code:G8-K-JJH)

I have discussed the findings and recommendations with the patient.
If applicable, a reminder letter will be sent to the patient
regarding the next appointment.

BI-RADS CATEGORY  1: Negative.

## 2023-08-04 ENCOUNTER — Encounter: Payer: Medicare PPO | Admitting: Physical Therapy

## 2023-08-11 ENCOUNTER — Encounter: Payer: Medicare PPO | Admitting: Physical Therapy

## 2023-08-18 ENCOUNTER — Encounter: Payer: Medicare PPO | Admitting: Physical Therapy

## 2023-08-20 ENCOUNTER — Ambulatory Visit: Payer: Medicare PPO | Admitting: Dermatology

## 2023-08-20 ENCOUNTER — Encounter: Payer: Self-pay | Admitting: Dermatology

## 2023-08-20 DIAGNOSIS — D692 Other nonthrombocytopenic purpura: Secondary | ICD-10-CM

## 2023-08-20 DIAGNOSIS — Z86018 Personal history of other benign neoplasm: Secondary | ICD-10-CM

## 2023-08-20 DIAGNOSIS — L578 Other skin changes due to chronic exposure to nonionizing radiation: Secondary | ICD-10-CM | POA: Diagnosis not present

## 2023-08-20 DIAGNOSIS — Z808 Family history of malignant neoplasm of other organs or systems: Secondary | ICD-10-CM

## 2023-08-20 DIAGNOSIS — D1801 Hemangioma of skin and subcutaneous tissue: Secondary | ICD-10-CM

## 2023-08-20 DIAGNOSIS — W908XXA Exposure to other nonionizing radiation, initial encounter: Secondary | ICD-10-CM

## 2023-08-20 DIAGNOSIS — L719 Rosacea, unspecified: Secondary | ICD-10-CM

## 2023-08-20 DIAGNOSIS — L820 Inflamed seborrheic keratosis: Secondary | ICD-10-CM

## 2023-08-20 DIAGNOSIS — L814 Other melanin hyperpigmentation: Secondary | ICD-10-CM

## 2023-08-20 DIAGNOSIS — D229 Melanocytic nevi, unspecified: Secondary | ICD-10-CM

## 2023-08-20 DIAGNOSIS — Z79899 Other long term (current) drug therapy: Secondary | ICD-10-CM

## 2023-08-20 DIAGNOSIS — Z85828 Personal history of other malignant neoplasm of skin: Secondary | ICD-10-CM

## 2023-08-20 DIAGNOSIS — Z1283 Encounter for screening for malignant neoplasm of skin: Secondary | ICD-10-CM

## 2023-08-20 DIAGNOSIS — Z7189 Other specified counseling: Secondary | ICD-10-CM

## 2023-08-20 DIAGNOSIS — L821 Other seborrheic keratosis: Secondary | ICD-10-CM

## 2023-08-20 MED ORDER — METRONIDAZOLE 0.75 % EX GEL: 1.0000 | CUTANEOUS | 11 refills | Status: AC

## 2023-08-20 NOTE — Patient Instructions (Addendum)

## 2023-08-20 NOTE — Progress Notes (Signed)
 Follow-Up Visit   Subjective  Krystal Mcintosh is a 68 y.o. female who presents for the following: Skin Cancer Screening and Full Body Skin Exam hx of BCCs, Dysplastic Nevus, check spots trunk itchy, check spots scalp itchy  The patient presents for Total-Body Skin Exam (TBSE) for skin cancer screening and mole check. The patient has spots, moles and lesions to be evaluated, some may be new or changing and the patient may have concern these could be cancer.    The following portions of the chart were reviewed this encounter and updated as appropriate: medications, allergies, medical history  Review of Systems:  No other skin or systemic complaints except as noted in HPI or Assessment and Plan.  Objective  Well appearing patient in no apparent distress; mood and affect are within normal limits.  A full examination was performed including scalp, head, eyes, ears, nose, lips, neck, chest, axillae, abdomen, back, buttocks, bilateral upper extremities, bilateral lower extremities, hands, feet, fingers, toes, fingernails, and toenails. All findings within normal limits unless otherwise noted below.   Relevant physical exam findings are noted in the Assessment and Plan.  scalp, face >20, hands x 4, L breast x 4, R breast x 4 (32) Stuck on waxy paps with erythema  Assessment & Plan   SKIN CANCER SCREENING PERFORMED TODAY.  ACTINIC DAMAGE - Chronic condition, secondary to cumulative UV/sun exposure - diffuse scaly erythematous macules with underlying dyspigmentation - Recommend daily broad spectrum sunscreen SPF 30+ to sun-exposed areas, reapply every 2 hours as needed.  - Staying in the shade or wearing long sleeves, sun glasses (UVA+UVB protection) and wide brim hats (4-inch brim around the entire circumference of the hat) are also recommended for sun protection.  - Call for new or changing lesions.  LENTIGINES, SEBORRHEIC KERATOSES, HEMANGIOMAS - Benign normal skin lesions -  Benign-appearing - Call for any changes  MELANOCYTIC NEVI - Tan-brown and/or pink-flesh-colored symmetric macules and papules - Benign appearing on exam today - Observation - Call clinic for new or changing moles - Recommend daily use of broad spectrum spf 30+ sunscreen to sun-exposed areas.   HISTORY OF BASAL CELL CARCINOMA OF THE SKIN - No evidence of recurrence today - Recommend regular full body skin exams - Recommend daily broad spectrum sunscreen SPF 30+ to sun-exposed areas, reapply every 2 hours as needed.  - Call if any new or changing lesions are noted between office visits  -R med lower leg above ankle, R chest mid parasternal, R medial pretibial, R nose  HISTORY OF DYSPLASTIC NEVUS No evidence of recurrence today Recommend regular full body skin exams Recommend daily broad spectrum sunscreen SPF 30+ to sun-exposed areas, reapply every 2 hours as needed.  Call if any new or changing lesions are noted between office visits  - R medial calf  FAMILY HISTORY OF SKIN CANCER What type(s):Melanoma IS Who affected:Daughter   ROSACEA face Exam Mid face erythema with telangiectasias papule nose  Chronic and persistent condition with duration or expected duration over one year. Condition is bothersome/symptomatic for patient. Currently flared.   Rosacea is a chronic progressive skin condition usually affecting the face of adults, causing redness and/or acne bumps. It is treatable but not curable. It sometimes affects the eyes (ocular rosacea) as well. It may respond to topical and/or systemic medication and can flare with stress, sun exposure, alcohol, exercise, topical steroids (including hydrocortisone/cortisone 10) and some foods.  Daily application of broad spectrum spf 30+ sunscreen to face is recommended to  reduce flares.  Patient denies grittiness of the eyes  Treatment Plan Start Metrogel 0.75% qd/bid to face  Long term medication management.  Patient is using long  term (months to years) prescription medication  to control their dermatologic condition.  These medications require periodic monitoring to evaluate for efficacy and side effects and may require periodic laboratory monitoring.  Purpura - Chronic; persistent and recurrent.  Treatable, but not curable. - Violaceous macules and patches - Benign - Related to trauma, age, sun damage and/or use of blood thinners, chronic use of topical and/or oral steroids - Observe - Can use OTC arnica containing moisturizer such as Dermend Bruise Formula if desired - Call for worsening or other concerns  INFLAMED SEBORRHEIC KERATOSIS (32) scalp, face >20, hands x 4, L breast x 4, R breast x 4 (32) Symptomatic, irritating, patient would like treated. Destruction of lesion - scalp, face >20, hands x 4, L breast x 4, R breast x 4 (32) Complexity: simple   Destruction method: cryotherapy   Informed consent: discussed and consent obtained   Timeout:  patient name, date of birth, surgical site, and procedure verified Lesion destroyed using liquid nitrogen: Yes   Region frozen until ice ball extended beyond lesion: Yes   Outcome: patient tolerated procedure well with no complications   Post-procedure details: wound care instructions given    Return in about 8 months (around 04/21/2024) for ISK f/u, 1 yr TBSE , Hx of BCC, Hx of Dysplastic nevi.  I, Rollie Clipper, RMA, am acting as scribe for Celine Collard, MD .   Documentation: I have reviewed the above documentation for accuracy and completeness, and I agree with the above.  Celine Collard, MD

## 2023-08-24 ENCOUNTER — Encounter: Payer: Self-pay | Admitting: Dermatology

## 2023-09-08 ENCOUNTER — Encounter: Payer: Medicare PPO | Admitting: Physical Therapy

## 2023-09-15 ENCOUNTER — Encounter: Payer: Medicare PPO | Admitting: Physical Therapy

## 2023-09-16 ENCOUNTER — Ambulatory Visit
Admission: RE | Admit: 2023-09-16 | Discharge: 2023-09-16 | Disposition: A | Discharge status: Home / Self Care | Source: Ambulatory Visit | Attending: Oncology | Admitting: Oncology

## 2023-09-16 DIAGNOSIS — Z1231 Encounter for screening mammogram for malignant neoplasm of breast: Secondary | ICD-10-CM | POA: Insufficient documentation

## 2023-09-16 DIAGNOSIS — N6099 Unspecified benign mammary dysplasia of unspecified breast: Secondary | ICD-10-CM | POA: Diagnosis present

## 2023-09-20 ENCOUNTER — Emergency Department
Admission: EM | Admit: 2023-09-20 | Discharge: 2023-09-21 | Disposition: A | Discharge status: Home / Self Care | Attending: Emergency Medicine | Admitting: Emergency Medicine

## 2023-09-20 ENCOUNTER — Emergency Department

## 2023-09-20 ENCOUNTER — Other Ambulatory Visit: Payer: Self-pay

## 2023-09-20 DIAGNOSIS — R079 Chest pain, unspecified: Secondary | ICD-10-CM | POA: Diagnosis present

## 2023-09-20 DIAGNOSIS — R6884 Jaw pain: Secondary | ICD-10-CM | POA: Diagnosis not present

## 2023-09-20 LAB — COMPREHENSIVE METABOLIC PANEL WITH GFR
ALT: 44 U/L (ref 0–44)
AST: 40 U/L (ref 15–41)
Albumin: 4.1 g/dL (ref 3.5–5.0)
Alkaline Phosphatase: 78 U/L (ref 38–126)
Anion gap: 10 (ref 5–15)
BUN: 21 mg/dL (ref 8–23)
CO2: 27 mmol/L (ref 22–32)
Calcium: 9.7 mg/dL (ref 8.9–10.3)
Chloride: 105 mmol/L (ref 98–111)
Creatinine, Ser: 0.78 mg/dL (ref 0.44–1.00)
GFR, Estimated: >60 mL/min (ref >60)
Glucose, Bld: 105 mg/dL — ABNORMAL HIGH (ref 70–99)
Potassium: 3.7 mmol/L (ref 3.5–5.1)
Sodium: 142 mmol/L (ref 135–145)
Total Bilirubin: 0.8 mg/dL (ref 0.0–1.2)
Total Protein: 7.5 g/dL (ref 6.5–8.1)

## 2023-09-20 LAB — CBC WITH DIFFERENTIAL/PLATELET
Abs Immature Granulocytes: 0.01 10*3/uL (ref 0.00–0.07)
Basophils Absolute: 0.1 10*3/uL (ref 0.0–0.1)
Basophils Relative: 1 %
Eosinophils Absolute: 0.1 10*3/uL (ref 0.0–0.5)
Eosinophils Relative: 2 %
HCT: 40.4 % (ref 36.0–46.0)
Hemoglobin: 13.2 g/dL (ref 12.0–15.0)
Immature Granulocytes: 0 %
Lymphocytes Relative: 48 %
Lymphs Abs: 2.6 10*3/uL (ref 0.7–4.0)
MCH: 30.4 pg (ref 26.0–34.0)
MCHC: 32.7 g/dL (ref 30.0–36.0)
MCV: 93.1 fL (ref 80.0–100.0)
Monocytes Absolute: 0.6 10*3/uL (ref 0.1–1.0)
Monocytes Relative: 11 %
Neutro Abs: 2.1 10*3/uL (ref 1.7–7.7)
Neutrophils Relative %: 38 %
Platelets: 272 10*3/uL (ref 150–400)
RBC: 4.34 MIL/uL (ref 3.87–5.11)
RDW: 12.4 % (ref 11.5–15.5)
WBC: 5.4 10*3/uL (ref 4.0–10.5)
nRBC: 0 % (ref 0.0–0.2)

## 2023-09-20 NOTE — ED Triage Notes (Signed)
 POV with CC of chest and jaw pain that started 30 mins prior to arrival. Denies any SOB, dizziness, and lightheadedness. A&Ox4. Ambulatory with steady gait.

## 2023-09-21 LAB — TROPONIN I (HIGH SENSITIVITY)
Troponin I (High Sensitivity): 3 ng/L (ref <18)
Troponin I (High Sensitivity): 3 ng/L (ref <18)

## 2023-09-21 NOTE — ED Notes (Signed)
 Waiting on Daughter for a ride.Krystal AasAaron AasETA 10 min

## 2023-09-21 NOTE — ED Provider Notes (Signed)
 [facility] Provider Note   Event Date/Time   First MD Initiated Contact with Patient 09/20/23 2306     (approximate) History  Chest Pain  HPI Krystal Mcintosh is a 68 y.o. female Location: Central chest Duration: Began 30 minutes prior to arrival  Timing: Resolved at this time Severity: Mild Quality: Pressure Context: Radiation to bilateral jaw Modifying factors: Denies Associated Symptoms: Denies ROS: Patient currently denies any vision changes, tinnitus, difficulty speaking, facial droop, sore throat, chest pain, shortness of breath, abdominal pain, nausea/vomiting/diarrhea, dysuria, or weakness/numbness/paresthesias in any extremity   Physical Exam  Triage Vital Signs: ED Triage Vitals [09/20/23 2300]  Encounter Vitals Group     BP (!) 143/102     Girls Systolic BP Percentile      Girls Diastolic BP Percentile      Boys Systolic BP Percentile      Boys Diastolic BP Percentile      Pulse Rate 70     Resp 19     Temp 98.4 F (36.9 C)     Temp Source Oral     SpO2 98 %     Weight 145 lb (65.8 kg)     Height 5' 1 (1.549 m)     Head Circumference      Peak Flow      Pain Score 3     Pain Loc      Pain Education      Exclude from Growth Chart    Most recent vital signs: Vitals:   09/20/23 2300  BP: (!) 143/102  Pulse: 70  Resp: 19  Temp: 98.4 F (36.9 C)  SpO2: 98%   General: Awake, oriented x4. CV:  Good peripheral perfusion. Resp:  Normal effort. Abd:  No distention. Other:  Elderly overweight Caucasian female resting comfortably in no acute distress ED Results / Procedures / Treatments  Labs (all labs ordered are listed, but only abnormal results are displayed) Labs Reviewed  COMPREHENSIVE METABOLIC PANEL WITH GFR - Abnormal; Notable for the following components:      Result Value   Glucose, Bld 105 (*)    All other components within normal limits  CBC WITH DIFFERENTIAL/PLATELET  TROPONIN I (HIGH SENSITIVITY)  TROPONIN  I (HIGH SENSITIVITY)   EKG ED ECG REPORT I, Charleen Conn, the attending physician, personally viewed and interpreted this ECG. Date: 09/21/2023 EKG Time: 2304 Rate: 68 Rhythm: normal sinus rhythm QRS Axis: normal Intervals: normal ST/T Wave abnormalities: normal Narrative Interpretation: no evidence of acute ischemia RADIOLOGY ED MD interpretation: Single view portable chest x-ray shows very mild left basilar atelectasis - All radiology independently interpreted and agree with radiology assessment Official radiology report(s): DG Chest Portable 1 View Result Date: 09/20/2023 CLINICAL DATA:  Chest and jaw pain. EXAM: PORTABLE CHEST 1 VIEW COMPARISON:  February 10, 2018 FINDINGS: The heart size and mediastinal contours are within normal limits. Very mild atelectasis is seen within the left lung base. No focal consolidation, pleural effusion or pneumothorax is identified. The visualized skeletal structures are unremarkable. IMPRESSION: Very mild left basilar atelectasis. Electronically Signed   By: Virgle Grime M.D.   On: 09/20/2023 23:14   PROCEDURES: Critical Care performed: No Procedures MEDICATIONS ORDERED IN ED: Medications - No data to display IMPRESSION / MDM / ASSESSMENT AND PLAN / ED COURSE  I reviewed the triage vital signs and the nursing notes.  The patient is on the cardiac monitor to evaluate for evidence of arrhythmia and/or significant heart rate changes. Patient's presentation is most consistent with acute presentation with potential threat to life or bodily function. 68 year old female with the above-stated past medical history who presents complaining of central chest pain that began 30 minutes prior to arrival and has resolved since onset Workup: ECG, CXR, CBC, BMP, Troponin Findings: ECG: No overt evidence of STEMI. No evidence of Brugada's sign, delta wave, epsilon wave, significantly prolonged QTc, or malignant arrhythmia HS  Troponin: Negative x2 Other Labs unremarkable for emergent problems. CXR: Without PTX, PNA, or widened mediastinum Last Stress Test: Never Last Heart Catheterization: Never HEART Score: 3  Given History, Exam, and Workup I have low suspicion for ACS, Pneumothorax, Pneumonia, Pulmonary Embolus, Tamponade, Aortic Dissection or other emergent problem as a cause for this presentation.   Reassesment: Prior to discharge patient's pain was controlled and they were well appearing.  Disposition:  Discharge. Strict return precautions discussed with patient with full understanding. Advised patient to follow up promptly with primary care provider   FINAL CLINICAL IMPRESSION(S) / ED DIAGNOSES   Final diagnoses:  Chest pain, unspecified type  Jaw pain   Rx / DC Orders   ED Discharge Orders          Ordered    Ambulatory referral to Cardiology       Comments: If you have not heard from the Cardiology office within the next 72 hours please call [phone].   09/21/23 0125           Note:  This document was prepared using Dragon voice recognition software and may include unintentional dictation errors.   Haeleigh Streiff K, MD 09/21/23 [phone]

## 2023-09-22 ENCOUNTER — Encounter: Payer: Medicare PPO | Admitting: Physical Therapy

## 2023-09-24 ENCOUNTER — Other Ambulatory Visit: Payer: Self-pay | Admitting: Internal Medicine

## 2023-09-24 DIAGNOSIS — Z136 Encounter for screening for cardiovascular disorders: Secondary | ICD-10-CM

## 2023-09-24 DIAGNOSIS — Z8249 Family history of ischemic heart disease and other diseases of the circulatory system: Secondary | ICD-10-CM

## 2023-09-24 DIAGNOSIS — Z8489 Family history of other specified conditions: Secondary | ICD-10-CM

## 2023-09-30 ENCOUNTER — Ambulatory Visit
Admission: RE | Admit: 2023-09-30 | Discharge: 2023-09-30 | Disposition: A | Discharge status: Home / Self Care | Source: Ambulatory Visit | Attending: Internal Medicine | Admitting: Internal Medicine

## 2023-09-30 DIAGNOSIS — Z8489 Family history of other specified conditions: Secondary | ICD-10-CM | POA: Insufficient documentation

## 2023-09-30 DIAGNOSIS — Z136 Encounter for screening for cardiovascular disorders: Secondary | ICD-10-CM | POA: Diagnosis present

## 2023-09-30 DIAGNOSIS — Z8249 Family history of ischemic heart disease and other diseases of the circulatory system: Secondary | ICD-10-CM | POA: Insufficient documentation

## 2023-11-21 ENCOUNTER — Ambulatory Visit
Admission: EM | Admit: 2023-11-21 | Discharge: 2023-11-21 | Disposition: A | Discharge status: Home / Self Care | Attending: Emergency Medicine | Admitting: Emergency Medicine

## 2023-11-21 ENCOUNTER — Encounter: Payer: Self-pay | Admitting: Emergency Medicine

## 2023-11-21 DIAGNOSIS — H5789 Other specified disorders of eye and adnexa: Secondary | ICD-10-CM

## 2023-11-21 MED ORDER — MOXIFLOXACIN HCL 0.5 % OP SOLN: 1.0000 [drp] | Freq: Three times a day (TID) | OPHTHALMIC | 0 refills | Status: AC

## 2023-11-21 NOTE — ED Provider Notes (Signed)
 Krystal Mcintosh    CSN: [id number] Arrival date & time: 11/21/23  1344      History   Chief Complaint Chief Complaint  Patient presents with   Eye Problem    HPI Krystal Mcintosh is a 68 y.o. female.   Patient presents for right eye pruritus, redness and sensation of a foreign body beginning 1 day ago.  Denies use of contacts but does wear glasses.  Able to see clearly.  Denies visual disturbance has attempted use of dry eye relief.  Past Medical History:  Diagnosis Date   Adult idiopathic generalized osteoporosis 08/11/2016   Basal cell carcinoma 04/22/2006   right med lower leg above ankle   Basal cell carcinoma 06/29/2007   right chest mid parasternal   Basal cell carcinoma 01/25/2014   right medial pretibial   Bulging lumbar disc 08/30/2013   Cataract, bilateral 08/30/2013   Cervical disc disease 08/30/2013   Degeneration of lumbar intervertebral disc 06/27/2019   Dysplastic nevus 07/26/2013   right medial calf   Erosive esophagitis 05/22/2015   Family history of adverse reaction to anesthesia    GERD (gastroesophageal reflux disease)    Hx of basal cell carcinoma    R nose txted in past   Hyperlipidemia 08/30/2013   Liveborn infant by vaginal delivery [birth year]   female   Liveborn infant by vaginal delivery 1991   Female   Major depressive disorder, recurrent, 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   Miscarriage 1980   Scoliosis deformity of spine 06/27/2019    Patient Active Problem List   Diagnosis Date Noted   Atypical ductal hyperplasia of breast 11/05/2022   Family history of cancer 11/05/2022   Osteopenia 11/05/2022   Major depressive disorder, recurrent, 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   Degeneration of lumbar intervertebral disc 06/27/2019   Scoliosis deformity of spine 06/27/2019   Medicare annual wellness visit, initial 10/15/2018   Adult idiopathic generalized osteoporosis 08/11/2016   Hyperlipidemia, mixed 08/11/2016   Erosive esophagitis 05/22/2015   Cervical  disc disease 08/30/2013    Past Surgical History:  Procedure Laterality Date   ABDOMINAL HYSTERECTOMY     BREAST BIOPSY Right 10/06/2022   MM RT BREAST BX W LOC DEV 1ST LESION IMAGE BX SPEC STEREO GUIDE 10/06/2022 [facility]   BREAST BIOPSY Right 10/27/2022   MM RT RADIO FREQUENCY TAG LOC MAMMO GUIDE 10/27/2022 [facility]   BREAST BIOPSY WITH RADIO FREQUENCY LOCALIZER Right 10/29/2022   Procedure: BREAST BIOPSY WITH RADIO FREQUENCY LOCALIZER;  Surgeon: Rodolph Romano, MD;  Location: [facility];  Service: General;  Laterality: Right;   BREAST EXCISIONAL BIOPSY Left 2012   benign   COLONOSCOPY N/A 04/30/2023   Procedure: COLONOSCOPY;  Surgeon: Onita Elspeth Sharper, DO;  Location: [facility] ENDOSCOPY;  Service: Gastroenterology;  Laterality: N/A;   ROTATOR CUFF REPAIR  11/2017   SHOULDER SURGERY      OB History   No obstetric history on file.      Home Medications    Prior to Admission medications   Medication Sig Start Date End Date Taking? Authorizing Provider  moxifloxacin  (VIGAMOX ) 0.5 % ophthalmic solution Place 1 drop into the right eye 3 (three) times daily. 11/21/23  Yes Isaiahs Chancy R, NP  acetaminophen  (TYLENOL ) 325 MG tablet Take 650 mg by mouth every 6 (six) hours as needed for mild pain.    [provider]  Calcium Carb-Cholecalciferol (OYSTER SHELL CALCIUM) 500-400 MG-UNIT TABS Take 500 mg by mouth in the morning and at bedtime.    [provider]  doxylamine, Sleep, (UNISOM) 25 MG tablet Take 25 mg by mouth at bedtime as needed for sleep.    [provider]  levothyroxine (SYNTHROID) 50 MCG tablet Take 50 mcg by mouth daily before breakfast.    [provider]  metroNIDAZOLE  (METROGEL ) 0.75 % gel Apply 1 Application topically as directed. qd to bid to face for Rosacea 08/20/23 08/19/24  Hester Alm BROCKS, MD  omeprazole (PRILOSEC) 40 MG capsule Take 40 mg by mouth daily.    [provider]  polyethylene glycol  powder (GLYCOLAX/MIRALAX) 17 GM/SCOOP powder Take 17 g by mouth daily.    [provider]  propranolol (INDERAL) 60 MG tablet Take 60 mg by mouth once.    [provider]  simvastatin (ZOCOR) 20 MG tablet Take 20 mg by mouth at bedtime. 03/15/19   [provider]  temazepam (RESTORIL) 15 MG capsule Take 15 mg by mouth at bedtime as needed for sleep.    [provider]  valACYclovir  (VALTREX ) 1000 MG tablet Take one tab po BID x 5 days at onset of outbreak. May take 1 tab po QD for prevention. 03/18/23   Hester Alm BROCKS, MD  venlafaxine XR (EFFEXOR-XR) 37.5 MG 24 hr capsule Take 37.5 mg by mouth daily with breakfast. 08/04/22   [provider]  vitamin B-12 (CYANOCOBALAMIN) 100 MCG tablet Take 100 mcg by mouth daily.    [provider]    Family History Family History  Problem Relation Age of Onset   Hypertension Mother    Thyroid disease Mother    Macular degeneration Mother    Kidney disease Mother    Heart disease Father    Pulmonary fibrosis Father    Aneurysm Father    Skin cancer Father    Cancer Brother        thymus cancer   Skin cancer Maternal Uncle    Cancer Maternal Grandmother        vulvar cancer   Aneurysm Paternal Grandmother    Melanoma Daughter 56   Breast cancer Other        mat great aunts x2, dx 33s-60s    Social History Social History   Tobacco Use   Smoking status: Never   Smokeless tobacco: Never  Vaping Use   Vaping status: Never Used  Substance Use Topics   Alcohol use: Not Currently    Alcohol/week: 1.0 standard drink of alcohol    Types: 1 Glasses of wine per week    Comment: occasional glass of wine   Drug use: Not Currently     Allergies   Patient has no known allergies.   Review of Systems Review of Systems   Physical Exam Triage Vital Signs ED Triage Vitals  Encounter Vitals Group     BP 11/21/23 1411 119/77     Girls Systolic BP Percentile --      Girls Diastolic BP  Percentile --      Boys Systolic BP Percentile --      Boys Diastolic BP Percentile --      Pulse Rate 11/21/23 1411 67     Resp 11/21/23 1411 18     Temp 11/21/23 1411 98.1 F (36.7 C)     Temp Source 11/21/23 1411 Oral     SpO2 11/21/23 1411 98 %     Weight --      Height --      Head Circumference --      Peak Flow --  Pain Score 11/21/23 1415 0     Pain Loc --      Pain Education --      Exclude from Growth Chart --    No data found.  Updated Vital Signs BP 119/77 (BP Location: Left Arm)   Pulse 67   Temp 98.1 F (36.7 C) (Oral)   Resp 18   SpO2 98%   Visual Acuity Right Eye Distance: 20/40 Left Eye Distance: 20/40 Bilateral Distance: 20/40  Right Eye Near:   Left Eye Near:    Bilateral Near:     Physical Exam Constitutional:      Appearance: Normal appearance.  Eyes:     Extraocular Movements: Extraocular movements intact.     Comments: Scant erythema present to the right conjunctiva, no drainage noted, vision grossly intact, extraocular movements intact  Pulmonary:     Effort: Pulmonary effort is normal.  Neurological:     Mental Status: She is alert and oriented to person, place, and time.      UC Treatments / Results  Labs (all labs ordered are listed, but only abnormal results are displayed) Labs Reviewed - No data to display  EKG   Radiology No results found.  Procedures Procedures (including critical care time)  Medications Ordered in UC Medications - No data to display  Initial Impression / Assessment and Plan / UC Course  I have reviewed the triage vital signs and the nursing notes.  Pertinent labs & imaging results that were available during my care of the patient were reviewed by me and considered in my medical decision making (see chart for details).  Irritation of the right eye  Possible corneal abrasion, patient denies injury, presentation most consistent with infection however denying drainage empirically provided  moxifloxacin  and discussed administration, recommended supportive care and given walker referral to ophthalmology if symptoms continue to persist Final Clinical Impressions(s) / UC Diagnoses   Final diagnoses:  Irritation of right eye     Discharge Instructions      Today you being treated for eye irritation most likely caused by an abrasion versus infection  Place one drop of moxifloxacin  into the effected eye every 8 hours while awake for 7 days. If the other eye starts to have symptoms you may use medication in it as well. Do not allow tip of dropper to touch eye.  May use cool compress for comfort and to remove discharge if present. Pat the eye, do not wipe.  If wearing contacts, dispose of current pair. Wear glasses until symptoms have resolved.   Do not rub eyes, this may cause more irritation.  May use benadryl as needed to help if itching present.  Please avoid use of eye makeup until symptoms clear.  If symptoms persist after use of medication, please follow up at Urgent Care or with ophthalmologist (eye doctor)    ED Prescriptions     Medication Sig Dispense Auth. Provider   moxifloxacin  (VIGAMOX ) 0.5 % ophthalmic solution Place 1 drop into the right eye 3 (three) times daily. 3 mL Teresa Shelba SAUNDERS, NP      PDMP not reviewed this encounter.   Teresa Shelba SAUNDERS, NP 11/21/23 [phone]

## 2023-11-21 NOTE — ED Triage Notes (Signed)
 Patient complains of itching, eye irritation and redness x 1 day. Patient has used OTC eye drops with no relief. Patient cannot remember name of drops.

## 2023-11-21 NOTE — Progress Notes (Unsigned)
 Cardiology Office Note  Date:  11/23/2023   ID:  Krystal Mcintosh, DOB 18-Jan-1956, MRN [id number]  PCP:  [name] FALCON, MD   Chief Complaint  Patient presents with   New Patient (Initial Visit)    Ref by Dr. [name] for chest pain. Patient was at [facility] ER with chest pain on 09/20/23. The patient is still c/o chest pain with mostly walking up an incline.      HPI:  Krystal Mcintosh a 68 y.o. femalewith past medical history of: Hyperlipidemia Depression Erosive esophagitis  aortic atherosclerosis on CT scan 5/23 Who presents by referral from Dr. Oneil Cleotilde for family history of abdominal aortic aneurysm, sudden death and referred by Dr. Artist [name]  for chest pain  Was getting ready for bed Seen in the emergency room September 21, 2023 Chest pain/pressure, 8/10 pain, jaw pain b/l, shoulder pain Daughter drove to ER, burping Cardiac workup negative  Stress at home, husband with progressive dementia Brother with cancer  Abdominal ultrasound through primary care with no AAA  Past 2 months ok Some chest pain 2-3 times since then  A1C 6 Total chol 227 LDL 146 on simvastatin 20 mg daily  EKG personally reviewed by myself on todays visit EKG Interpretation Date/Time:  Monday November 23 2023 10:13:17 EDT Ventricular Rate:  71 PR Interval:  150 QRS Duration:  84 QT Interval:  384 QTC Calculation: 417 R Axis:   -18  Text Interpretation: Normal sinus rhythm Nonspecific ST abnormality When compared with ECG of 20-Sep-2023 23:04, No significant change was found Confirmed by Perla Lye [phone]) on 11/23/2023 10:33:25 AM   PMH:   has a past medical history of Adult idiopathic generalized osteoporosis (08/11/2016), Basal cell carcinoma (04/22/2006), Basal cell carcinoma (06/29/2007), Basal cell carcinoma (01/25/2014), Bulging lumbar disc (08/30/2013), Cataract, bilateral (08/30/2013), Cervical disc disease (08/30/2013), Degeneration of lumbar intervertebral disc (06/27/2019),  Dysplastic nevus (07/26/2013), Erosive esophagitis (05/22/2015), Family history of adverse reaction to anesthesia, GERD (gastroesophageal reflux disease), basal cell carcinoma, Hyperlipidemia (08/30/2013), Liveborn infant by vaginal delivery (1988), Liveborn infant by vaginal delivery (1991), Major depressive disorder, recurrent, mild (HCC) (03/14/2020), Miscarriage (1980), and Scoliosis deformity of spine (06/27/2019).  PSH:    Past Surgical History:  Procedure Laterality Date   ABDOMINAL HYSTERECTOMY     BREAST BIOPSY Right 10/06/2022   MM RT BREAST BX W LOC DEV 1ST LESION IMAGE BX SPEC STEREO GUIDE 10/06/2022 [facility]   BREAST BIOPSY Right 10/27/2022   MM RT RADIO FREQUENCY TAG LOC MAMMO GUIDE 10/27/2022 [facility]   BREAST BIOPSY WITH RADIO FREQUENCY LOCALIZER Right 10/29/2022   Procedure: BREAST BIOPSY WITH RADIO FREQUENCY LOCALIZER;  Surgeon: Rodolph Romano, MD;  Location: [facility];  Service: General;  Laterality: Right;   BREAST EXCISIONAL BIOPSY Left 2012   benign   COLONOSCOPY N/A 04/30/2023   Procedure: COLONOSCOPY;  Surgeon: Onita Elspeth Sharper, DO;  Location: [facility];  Service: Gastroenterology;  Laterality: N/A;   ROTATOR CUFF REPAIR  11/2017   SHOULDER SURGERY      Current Outpatient Medications  Medication Sig Dispense Refill   acetaminophen  (TYLENOL ) 325 MG tablet Take 650 mg by mouth every 6 (six) hours as needed for mild pain.     Calcium Carb-Cholecalciferol (OYSTER SHELL CALCIUM) 500-400 MG-UNIT TABS Take 500 mg by mouth in the morning and at bedtime.     doxylamine, Sleep, (UNISOM) 25 MG tablet Take 25 mg by mouth at bedtime as needed for sleep.     levothyroxine (SYNTHROID) 50 MCG tablet Take  50 mcg by mouth daily before breakfast.     metroNIDAZOLE  (METROGEL ) 0.75 % gel Apply 1 Application topically as directed. qd to bid to face for Rosacea 45 g 11   moxifloxacin  (VIGAMOX ) 0.5 % ophthalmic solution Place 1 drop into the right eye 3 (three)  times daily. 3 mL 0   omeprazole (PRILOSEC) 40 MG capsule Take 40 mg by mouth daily.     polyethylene glycol powder (GLYCOLAX/MIRALAX) 17 GM/SCOOP powder Take 17 g by mouth daily.     propranolol (INDERAL) 60 MG tablet Take 60 mg by mouth once.     simvastatin (ZOCOR) 20 MG tablet Take 20 mg by mouth at bedtime.     temazepam (RESTORIL) 15 MG capsule Take 15 mg by mouth at bedtime as needed for sleep.     valACYclovir  (VALTREX ) 1000 MG tablet Take one tab po BID x 5 days at onset of outbreak. May take 1 tab po QD for prevention. 30 tablet 11   venlafaxine XR (EFFEXOR-XR) 37.5 MG 24 hr capsule Take 37.5 mg by mouth daily with breakfast.     vitamin B-12 (CYANOCOBALAMIN) 100 MCG tablet Take 100 mcg by mouth daily.     No current facility-administered medications for this visit.    Allergies:   Patient has no known allergies.   Social History:  The patient  reports that she has never smoked. She has never used smokeless tobacco. She reports that she does not currently use alcohol after a past usage of about 1.0 standard drink of alcohol per week. She reports that she does not currently use drugs.   Family History:   family history includes Aneurysm in her father and paternal grandmother; Breast cancer in an other family member; Cancer in her brother and maternal grandmother; Heart disease in her father; Hypertension in her mother; Kidney disease in her mother; Macular degeneration in her mother; Melanoma (age of onset: 44) in her daughter; Pulmonary fibrosis in her father; Skin cancer in her father and maternal uncle; Thyroid disease in her mother.    Review of Systems: Review of Systems  Constitutional: Negative.   HENT: Negative.    Respiratory: Negative.    Cardiovascular:  Positive for chest pain.  Gastrointestinal: Negative.   Musculoskeletal: Negative.   Neurological: Negative.   Psychiatric/Behavioral: Negative.    All other systems reviewed and are negative.   PHYSICAL  EXAM: VS:  BP 110/64 (BP Location: Right Arm, Patient Position: Sitting, Cuff Size: Normal)   Pulse 71   Ht 5' 1 (1.549 m)   Wt 153 lb 6 oz (69.6 kg)   SpO2 95%   BMI 28.98 kg/m  , BMI Body mass index is 28.98 kg/m. GEN: Well nourished, well developed, in no acute distress HEENT: normal Neck: no JVD, carotid bruits, or masses Cardiac: RRR; no murmurs, rubs, or gallops,no edema  Respiratory:  clear to auscultation bilaterally, normal work of breathing GI: soft, nontender, nondistended, + BS MS: no deformity or atrophy Skin: warm and dry, no rash Neuro:  Strength and sensation are intact Psych: euthymic mood, full affect    Recent Labs: 09/20/2023: ALT 44; BUN 21; Creatinine, Ser 0.78; Hemoglobin 13.2; Platelets 272; Potassium 3.7; Sodium 142    Lipid Panel No results found for: CHOL, HDL, LDLCALC, TRIG    Wt Readings from Last 3 Encounters:  11/23/23 153 lb 6 oz (69.6 kg)  09/20/23 145 lb (65.8 kg)  03/12/23 146 lb (66.2 kg)       ASSESSMENT AND PLAN:  Problem List Items Addressed This Visit       Cardiology Problems   Hyperlipidemia, mixed     Other   Erosive esophagitis   Other Visit Diagnoses       Chest pain of uncertain etiology    -  Primary   Relevant Orders   EKG 12-Lead (Completed)       Chest pain/angina Severe chest pain June 2025, seen in the ER Several episodes of chest pain since that time typically lasting for 15 minutes resolving without intervention History of aortic atherosclerosis, hyperlipidemia -Recommended cardiac CTA to rule out ischemia  Tremor Currently taking long-acting propranolol 60 daily  Hyperlipidemia Numbers continue to run high, taking simvastatin 20 mg daily Depending on cardiac CTA findings, may need more aggressive therapy  Erosive esophagitis Stable symptoms on PPI   Signed, Velinda Lunger, M.D., Ph.D. [facility] [place], [place] [phone]

## 2023-11-21 NOTE — Discharge Instructions (Addendum)
 Today you being treated for eye irritation most likely caused by an abrasion versus infection  Place one drop of moxifloxacin  into the effected eye every 8 hours while awake for 7 days. If the other eye starts to have symptoms you may use medication in it as well. Do not allow tip of dropper to touch eye.  May use cool compress for comfort and to remove discharge if present. Pat the eye, do not wipe.  If wearing contacts, dispose of current pair. Wear glasses until symptoms have resolved.   Do not rub eyes, this may cause more irritation.  May use benadryl as needed to help if itching present.  Please avoid use of eye makeup until symptoms clear.  If symptoms persist after use of medication, please follow up at Urgent Care or with ophthalmologist (eye doctor)

## 2023-11-23 ENCOUNTER — Encounter: Payer: Self-pay | Admitting: Cardiovascular Disease

## 2023-11-23 ENCOUNTER — Ambulatory Visit: Attending: Cardiovascular Disease | Admitting: Cardiovascular Disease

## 2023-11-23 VITALS — BP 110/64 | HR 71 | Ht 61.0 in | Wt 153.4 lb

## 2023-11-23 DIAGNOSIS — R072 Precordial pain: Secondary | ICD-10-CM

## 2023-11-23 DIAGNOSIS — K2210 Ulcer of esophagus without bleeding: Secondary | ICD-10-CM | POA: Diagnosis not present

## 2023-11-23 DIAGNOSIS — R079 Chest pain, unspecified: Secondary | ICD-10-CM

## 2023-11-23 DIAGNOSIS — E782 Mixed hyperlipidemia: Secondary | ICD-10-CM | POA: Diagnosis not present

## 2023-11-23 DIAGNOSIS — I209 Angina pectoris, unspecified: Secondary | ICD-10-CM

## 2023-11-23 DIAGNOSIS — Z79899 Other long term (current) drug therapy: Secondary | ICD-10-CM

## 2023-11-23 MED ORDER — METOPROLOL TARTRATE 50 MG PO TABS: 50.0000 mg | ORAL_TABLET | Freq: Once | ORAL | 0 refills | Status: AC

## 2023-11-23 NOTE — Patient Instructions (Addendum)
 Medication Instructions:   No changes  Please take Metoprolol  Tartrate 50 MG two hours prior to Cardiac CTA.   If you need a refill on your cardiac medications before your next appointment, please call your pharmacy.   Lab work: Your provider would like for you to have following labs drawn today BMP.    Testing/Procedures:   Your cardiac CT will be scheduled at one of the below locations:   [facility] [address] [place], [place] [address] [phone]   Please follow these instructions carefully (unless otherwise directed):  An IV will be required for this test and Nitroglycerin will be given.   On the Night Before the Test: Be sure to Drink plenty of water . Do not consume any caffeinated/decaffeinated beverages or chocolate 12 hours prior to your test. Do not take any antihistamines 12 hours prior to your test.  On the Day of the Test: Drink plenty of water  until 1 hour prior to the test. Do not eat any food 1 hour prior to test. You may take your regular medications prior to the test.  Take metoprolol  (Lopressor ) two hours prior to test. If you take Furosemide/Hydrochlorothiazide/Spironolactone/Chlorthalidone, please HOLD on the morning of the test. Patients who wear a continuous glucose monitor MUST remove the device prior to scanning. FEMALES- please wear underwire-free bra if available, avoid dresses & tight clothing  After the Test: Drink plenty of water . After receiving IV contrast, you may experience a mild flushed feeling. This is normal. On occasion, you may experience a mild rash up to 24 hours after the test. This is not dangerous. If this occurs, you can take Benadryl 25 mg, Zyrtec, Claritin, or Allegra and increase your fluid intake. (Patients taking Tikosyn should avoid Benadryl, and may take Zyrtec, Claritin, or Allegra) If you experience trouble breathing, this can be serious. If it is severe call 911 IMMEDIATELY. If it is  mild, please call our office.  We will call to schedule your test 2-4 weeks out understanding that some insurance companies will need an authorization prior to the service being performed.   For more information and frequently asked questions, please visit our website : [email or web address]  For non-scheduling related questions, please contact the cardiac imaging nurse navigator should you have any questions/concerns: Cardiac Imaging Nurse Navigators Direct Office Dial: [phone]   For scheduling needs, including cancellations and rescheduling, please call [place], [phone].   Follow-Up: At [facility], you and your health needs are our priority.  As part of our continuing mission to provide you with exceptional heart care, we have created designated Provider Care Teams.  These Care Teams include your primary Cardiologist (physician) and Advanced Practice Providers (APPs -  Physician Assistants and Nurse Practitioners) who all work together to provide you with the care you need, when you need it.  You will need a follow up appointment in as needed  Providers on your designated Care Team:   Lonni Meager, NP Bernardino Bring, PA-C [place]ence [place], [place]  COVID-19 Vaccine Information can be found at: [email or web address] For questions related to vaccine distribution or appointments, please email [email or web address] or call [phone].

## 2023-11-24 ENCOUNTER — Ambulatory Visit: Payer: Self-pay | Admitting: Cardiovascular Disease

## 2023-11-24 LAB — BASIC METABOLIC PANEL WITH GFR
BUN/Creatinine Ratio: 15 (ref 12–28)
BUN: 11 mg/dL (ref 8–27)
CO2: 25 mmol/L (ref 20–29)
Calcium: 10.3 mg/dL (ref 8.7–10.3)
Chloride: 99 mmol/L (ref 96–106)
Creatinine, Ser: 0.75 mg/dL (ref 0.57–1.00)
Glucose: 86 mg/dL (ref 70–99)
Potassium: 4.5 mmol/L (ref 3.5–5.2)
Sodium: 138 mmol/L (ref 134–144)
eGFR: 86 mL/min/1.73 (ref >59)

## 2023-12-11 ENCOUNTER — Other Ambulatory Visit: Payer: Self-pay | Admitting: Internal Medicine

## 2023-12-11 DIAGNOSIS — Z0000 Encounter for general adult medical examination without abnormal findings: Secondary | ICD-10-CM

## 2023-12-11 DIAGNOSIS — R1084 Generalized abdominal pain: Secondary | ICD-10-CM

## 2023-12-15 ENCOUNTER — Encounter (HOSPITAL_COMMUNITY): Payer: Self-pay

## 2023-12-17 ENCOUNTER — Ambulatory Visit: Admission: RE | Admit: 2023-12-17 | Source: Ambulatory Visit

## 2024-01-07 ENCOUNTER — Ambulatory Visit
Admission: RE | Admit: 2024-01-07 | Discharge: 2024-01-07 | Disposition: A | Discharge status: Home / Self Care | Source: Ambulatory Visit | Attending: Cardiovascular Disease | Admitting: Cardiovascular Disease

## 2024-01-07 DIAGNOSIS — R1084 Generalized abdominal pain: Secondary | ICD-10-CM | POA: Insufficient documentation

## 2024-01-07 DIAGNOSIS — R072 Precordial pain: Secondary | ICD-10-CM | POA: Insufficient documentation

## 2024-01-07 DIAGNOSIS — Z0000 Encounter for general adult medical examination without abnormal findings: Secondary | ICD-10-CM | POA: Insufficient documentation

## 2024-01-07 MED ORDER — IOHEXOL 350 MG/ML SOLN
100.0000 mL | Freq: Once | INTRAVENOUS | Status: AC | PRN
  Administered 2024-01-07: 100 mL via INTRAVENOUS

## 2024-01-07 MED ORDER — NITROGLYCERIN 0.4 MG SL SUBL
0.8000 mg | SUBLINGUAL_TABLET | Freq: Once | SUBLINGUAL | Status: AC
  Administered 2024-01-07: 0.8 mg via SUBLINGUAL
  Filled 2024-01-07: qty 25

## 2024-01-07 NOTE — Progress Notes (Signed)
 Patient tolerated procedure well. W/C to lobby.  Ambulate w/o difficulty. Denies light headedness or being dizzy. Encouraged to drink extra water today and reasoning explained. Verbalized understanding. All questions answered. ABC intact. No further needs. Discharge from procedure area w/o issues.

## 2024-01-11 ENCOUNTER — Encounter: Payer: Self-pay | Admitting: Emergency Medicine

## 2024-01-12 ENCOUNTER — Ambulatory Visit: Payer: Medicare PPO | Admitting: Oncology

## 2024-01-13 ENCOUNTER — Inpatient Hospital Stay: Payer: Medicare PPO | Attending: Oncology | Admitting: Oncology

## 2024-01-13 ENCOUNTER — Encounter: Payer: Self-pay | Admitting: Oncology

## 2024-01-13 VITALS — BP 119/80 | HR 64 | Temp 96.7°F | Resp 18 | Wt 154.8 lb

## 2024-01-13 DIAGNOSIS — N6099 Unspecified benign mammary dysplasia of unspecified breast: Secondary | ICD-10-CM

## 2024-01-13 DIAGNOSIS — Z808 Family history of malignant neoplasm of other organs or systems: Secondary | ICD-10-CM | POA: Insufficient documentation

## 2024-01-13 DIAGNOSIS — Z85828 Personal history of other malignant neoplasm of skin: Secondary | ICD-10-CM | POA: Insufficient documentation

## 2024-01-13 DIAGNOSIS — M8580 Other specified disorders of bone density and structure, unspecified site: Secondary | ICD-10-CM | POA: Diagnosis not present

## 2024-01-13 DIAGNOSIS — N6011 Diffuse cystic mastopathy of right breast: Secondary | ICD-10-CM | POA: Insufficient documentation

## 2024-01-13 NOTE — Assessment & Plan Note (Addendum)
 Atypical ductal hyperplasia is associated with a generalized, bilateral increase in breast cancer risk. Recommendation: Active Surveillance: life time annual screening mammography and annual MRI breast. -Tyrer Cuzick 24.4%  recommend annual MRI breast alternate with Mammogram  Endocrine therapy: Tamoxifen option was discussed with patient.Rationale and side effects were reviewed with patient.   Patient declines.

## 2024-01-13 NOTE — Progress Notes (Signed)
 Hematology/Oncology Progress note [phone] [phone]        REFERRING PROVIDER: [name] FALCON, MD    CHIEF COMPLAINTS/PURPOSE OF CONSULTATION:  Right breast atypical ductal hyperplasia  ASSESSMENT & PLAN:   Atypical ductal hyperplasia of breast Atypical ductal hyperplasia is associated with a generalized, bilateral increase in breast cancer risk. Recommendation: Active Surveillance: life time annual screening mammography and annual MRI breast. -Tyrer Cuzick 24.4%  recommend annual MRI breast alternate with Mammogram  Endocrine therapy: Tamoxifen option was discussed with patient.Rationale and side effects were reviewed with patient.   Patient declines.    Osteopenia Recommend calcium and vitamin D supplementation. Osteopenia on DEXA August 2024,  FRAX 23% 10 year major fracture risk   Orders Placed This Encounter  Procedures   MR BREAST BILATERAL W WO CONTRAST INC CAD    Standing Status:   Future    Expected Date:   03/16/2024    Expiration Date:   01/12/2025    If indicated for the ordered procedure, I authorize the administration of contrast media per Radiology protocol:   Yes    What is the patient's sedation requirement?:   No Sedation    Does the patient have a pacemaker or implanted devices?:   No    Radiology Contrast Protocol - do NOT remove file path:   [email or web address]    Preferred imaging location?:   [facility] (table limit - 500lbs)   MM 3D SCREENING MAMMOGRAM BILATERAL BREAST    Standing Status:   Future    Expected Date:   09/15/2024    Expiration Date:   01/12/2025    Reason for Exam (SYMPTOM  OR DIAGNOSIS REQUIRED):   adh breast    Preferred imaging location?:   [facility]   Follow-up  1 year All questions were answered. The patient knows to call the clinic with any problems, questions or concerns.  Zelphia Cap, MD, PhD [facility] Hematology Oncology 01/13/2024     HISTORY OF PRESENTING ILLNESS:  Krystal Mcintosh 68 y.o. female presents to establish care for [facility]   09/16/2022, bilateral screening mammogram showed calcification in the right breast warrants further examination.  No findings suspicious for malignancy in the left breast. 09/22/2022 unilateral right diagnostic mammogram showed indeterminate right breast calcifications. 10/06/2022 patient underwent right breast biopsy. Pathology showed foci of atypical ductal adenosis, 1 mm which with associated calcifications. Usual ductal hyperplasia, adenosis, microcysts and calcifications.  Focal pseudo angiomatous stromal hyperplasia  Patient was evaluated by Dr. Cesar underwent right breast lumpectomy on 10/29/2022. Pathology showed focus of residual atypical ductal hyperplasia with calcifications.  Margins are negative.  Cystic papillary apocrine metaplasia.  Scout tag present negative for malignancy.  Patient was referred to establish care with oncology for further evaluation of chemoprevention. Patient reports family history of cancer-great maternal aunt with breast cancer.  Menarche at age of 35-13 First live birth at age of 33 OCP use: about 20 years  History of total hysterectomy: yes, 2003, at age of 68 yo  She is postmenopausal History of HRT use: 21 years, she stopped in July 2024. History of chest radiation:  no  Number of previous breast biopsies:  left lumpectomy, pathology showed no cancer.   INTERVAL HISTORY Krystal Mcintosh is a 68 y.o. female who has above history reviewed by me today presents for follow up visit for ADH.  Takes calcium and vitamin supplementation. No new breast concerns.  No new complaints.   MEDICAL HISTORY:  Past Medical History:  Diagnosis Date   Adult idiopathic generalized osteoporosis 08/11/2016   Basal cell carcinoma 04/22/2006   right med lower leg above ankle   Basal cell carcinoma 06/29/2007   right chest mid parasternal   Basal cell  carcinoma 01/25/2014   right medial pretibial   Bulging lumbar disc 08/30/2013   Cataract, bilateral 08/30/2013   Cervical disc disease 08/30/2013   Degeneration of lumbar intervertebral disc 06/27/2019   Dysplastic nevus 07/26/2013   right medial calf   Erosive esophagitis 05/22/2015   Family history of adverse reaction to anesthesia    GERD (gastroesophageal reflux disease)    Hx of basal cell carcinoma    R nose txted in past   Hyperlipidemia 08/30/2013   Liveborn infant by vaginal delivery 32   female   Liveborn infant by vaginal delivery 1991   Female   Major depressive disorder, recurrent, 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   Miscarriage 1980   Scoliosis deformity of spine 06/27/2019    SURGICAL HISTORY: Past Surgical History:  Procedure Laterality Date   ABDOMINAL HYSTERECTOMY     BREAST BIOPSY Right 10/06/2022   MM RT BREAST BX W LOC DEV 1ST LESION IMAGE BX SPEC STEREO GUIDE 10/06/2022 [facility]   BREAST BIOPSY Right 10/27/2022   MM RT RADIO FREQUENCY TAG LOC MAMMO GUIDE 10/27/2022 [facility]   BREAST BIOPSY WITH RADIO FREQUENCY LOCALIZER Right 10/29/2022   Procedure: BREAST BIOPSY WITH RADIO FREQUENCY LOCALIZER;  Surgeon: Rodolph Romano, MD;  Location: [facility] ORS;  Service: General;  Laterality: Right;   BREAST EXCISIONAL BIOPSY Left 2012   benign   COLONOSCOPY N/A 04/30/2023   Procedure: COLONOSCOPY;  Surgeon: Onita Elspeth Sharper, DO;  Location: [facility];  Service: Gastroenterology;  Laterality: N/A;   ROTATOR CUFF REPAIR  11/2017   SHOULDER SURGERY      SOCIAL HISTORY: Social History   Socioeconomic History   Marital status: Married    Spouse name: Jerel   Number of children: Not on file   Years of education: Not on file   Highest education level: Not on file  Occupational History   Not on file  Tobacco Use   Smoking status: Never   Smokeless tobacco: Never  Vaping Use   Vaping status: Never Used  Substance and Sexual Activity   Alcohol use:  Not Currently    Alcohol/week: 1.0 standard drink of alcohol    Types: 1 Glasses of wine per week    Comment: occasional glass of wine   Drug use: Not Currently   Sexual activity: Not on file  Other Topics Concern   Not on file  Social History Narrative   Not on file   Social Drivers of Health   Financial Resource Strain: Low Risk  (12/09/2023)   Received from [facility]   Overall Financial Resource Strain (CARDIA)    Difficulty of Paying Living Expenses: Not hard at all  Food Insecurity: No Food Insecurity (12/09/2023)   Received from [facility]   Hunger Vital Sign    Within the past 12 months, you worried that your food would run out before you got the money to buy more.: Never true    Within the past 12 months, the food you bought just didn't last and you didn't have money to get more.: Never true  Transportation Needs: No Transportation Needs (12/09/2023)   Received from [facility] - Transportation    In the past 12 months, has lack of transportation kept you from  medical appointments or from getting medications?: No    Lack of Transportation (Non-Medical): No  Physical Activity: Not on file  Stress: Not on file  Social Connections: Not on file  Intimate Partner Violence: Not on file    FAMILY HISTORY: Family History  Problem Relation Age of Onset   Hypertension Mother    Thyroid disease Mother    Macular degeneration Mother    Kidney disease Mother    Heart disease Father    Pulmonary fibrosis Father    Aneurysm Father    Skin cancer Father    Cancer Brother        thymus cancer   Skin cancer Maternal Uncle    Cancer Maternal Grandmother        vulvar cancer   Aneurysm Paternal Grandmother    Melanoma Daughter 69   Breast cancer Other        mat great aunts x2, dx 80s-60s    ALLERGIES:  has no known allergies.  MEDICATIONS:  Current Outpatient Medications  Medication Sig Dispense Refill    acetaminophen  (TYLENOL ) 325 MG tablet Take 650 mg by mouth every 6 (six) hours as needed for mild pain.     Calcium Carb-Cholecalciferol (OYSTER SHELL CALCIUM) 500-400 MG-UNIT TABS Take 500 mg by mouth in the morning and at bedtime.     doxylamine, Sleep, (UNISOM) 25 MG tablet Take 25 mg by mouth at bedtime as needed for sleep.     levothyroxine (SYNTHROID) 50 MCG tablet Take 50 mcg by mouth daily before breakfast.     metoprolol  tartrate (LOPRESSOR ) 50 MG tablet Take 1 tablet (50 mg total) by mouth once for 1 dose. Take 2 hours prior to cardiac CTA. 1 tablet 0   metroNIDAZOLE  (METROGEL ) 0.75 % gel Apply 1 Application topically as directed. qd to bid to face for Rosacea 45 g 11   moxifloxacin  (VIGAMOX ) 0.5 % ophthalmic solution Place 1 drop into the right eye 3 (three) times daily. 3 mL 0   omeprazole (PRILOSEC) 40 MG capsule Take 40 mg by mouth daily.     polyethylene glycol powder (GLYCOLAX/MIRALAX) 17 GM/SCOOP powder Take 17 g by mouth daily.     propranolol ER (INDERAL LA) 60 MG 24 hr capsule Take 1 capsule (60 mg total) by mouth daily.     simvastatin (ZOCOR) 20 MG tablet Take 20 mg by mouth at bedtime.     temazepam (RESTORIL) 15 MG capsule Take 15 mg by mouth at bedtime as needed for sleep.     valACYclovir  (VALTREX ) 1000 MG tablet Take one tab po BID x 5 days at onset of outbreak. May take 1 tab po QD for prevention. 30 tablet 11   venlafaxine XR (EFFEXOR-XR) 37.5 MG 24 hr capsule Take 37.5 mg by mouth daily with breakfast.     vitamin B-12 (CYANOCOBALAMIN) 100 MCG tablet Take 100 mcg by mouth daily.     No current facility-administered medications for this visit.    Review of Systems  Constitutional:  Negative for appetite change, chills, fatigue and fever.  HENT:   Negative for hearing loss and voice change.   Eyes:  Negative for eye problems.  Respiratory:  Negative for chest tightness and cough.   Cardiovascular:  Negative for chest pain.  Gastrointestinal:  Negative for  abdominal distention, abdominal pain and blood in stool.  Endocrine: Negative for hot flashes.  Genitourinary:  Negative for difficulty urinating and frequency.   Musculoskeletal:  Negative for arthralgias.  Skin:  Negative for  itching and rash.  Neurological:  Negative for extremity weakness.  Hematological:  Negative for adenopathy.  Psychiatric/Behavioral:  Negative for confusion.      PHYSICAL EXAMINATION: ECOG PERFORMANCE STATUS: 0 - Asymptomatic  Vitals:   01/13/24 1034  BP: 119/80  Pulse: 64  Resp: 18  Temp: (!) 96.7 F (35.9 C)  SpO2: 99%   Filed Weights   01/13/24 1034  Weight: 154 lb 12.8 oz (70.2 kg)    Physical Exam Constitutional:      General: She is not in acute distress.    Appearance: She is not diaphoretic.  HENT:     Head: Normocephalic and atraumatic.  Eyes:     General: No scleral icterus. Cardiovascular:     Rate and Rhythm: Normal rate.  Pulmonary:     Effort: Pulmonary effort is normal. No respiratory distress.  Abdominal:     General: There is no distension.     Palpations: Abdomen is soft.     Tenderness: There is no abdominal tenderness.  Musculoskeletal:        General: Normal range of motion.     Cervical back: Normal range of motion and neck supple.  Skin:    General: Skin is warm and dry.  Neurological:     Mental Status: She is alert and oriented to person, place, and time. Mental status is at baseline.     Motor: No abnormal muscle tone.  Psychiatric:        Mood and Affect: Mood and affect normal.    Breast exam was performed in seated and lying down position. Patient is status post right lumpectomy with a well-healed surgical scar.   No palpable breast masses bilaterally.  No palpable axillary adenopathy bilaterally.   LABORATORY DATA:  I have reviewed the data as listed    Latest Ref Rng & Units 09/20/2023   11:16 PM 10/27/2022    1:29 PM  CBC  WBC 4.0 - 10.5 K/uL 5.4  4.7   Hemoglobin 12.0 - 15.0 g/dL 86.7  86.2    Hematocrit 36.0 - 46.0 % 40.4  40.7   Platelets 150 - 400 K/uL 272  268       Latest Ref Rng & Units 11/23/2023   11:13 AM 09/20/2023   11:16 PM 10/27/2022    1:29 PM  CMP  Glucose 70 - 99 mg/dL 86  894  893   BUN 8 - 27 mg/dL 11  21  12    Creatinine 0.57 - 1.00 mg/dL 9.24  9.21  9.30   Sodium 134 - 144 mmol/L 138  142  140   Potassium 3.5 - 5.2 mmol/L 4.5  3.7  3.6   Chloride 96 - 106 mmol/L 99  105  107   CO2 20 - 29 mmol/L 25  27  26    Calcium 8.7 - 10.3 mg/dL 89.6  9.7  9.0   Total Protein 6.5 - 8.1 g/dL  7.5    Total Bilirubin 0.0 - 1.2 mg/dL  0.8    Alkaline Phos 38 - 126 U/L  78    AST 15 - 41 U/L  40    ALT 0 - 44 U/L  44       RADIOGRAPHIC STUDIES: I have personally reviewed the radiological images as listed and agreed with the findings in the report. CT ABDOMEN PELVIS WO CONTRAST Result Date: 01/10/2024 CLINICAL DATA:  Abdominal pain. EXAM: CT ABDOMEN AND PELVIS WITHOUT CONTRAST TECHNIQUE: Multidetector CT imaging of the abdomen and  pelvis was performed following the standard protocol without IV contrast. RADIATION DOSE REDUCTION: This exam was performed according to the departmental dose-optimization program which includes automated exposure control, adjustment of the mA and/or kV according to patient size and/or use of iterative reconstruction technique. COMPARISON:  CT Aug 13, 2021 FINDINGS: Lower chest: Bibasilar atelectasis versus scarring. Hepatobiliary: Subcentimeter bilobar hypodense hepatic lesions technically too small to accurately characterize but statistically likely benign. Gallbladder is unremarkable. No biliary ductal dilation. Pancreas: No pancreatic ductal dilation or evidence of acute inflammation. Spleen: No splenomegaly. Adrenals/Urinary Tract: No suspicious adrenal nodule/mass. No hydronephrosis. No renal, ureteral or bladder calculi. Fluid density exophytic 9 mm right lower pole renal lesion on image 38/2 is compatible with a cyst. Urinary bladder is  unremarkable for degree of distension. Stomach/Bowel: Stomach is unremarkable for degree of distension. No pathologic dilation of small or large bowel. No evidence of acute bowel inflammation. Scattered colonic diverticulosis. Vascular/Lymphatic: Normal caliber abdominal aorta. Smooth IVC contours. No pathologically enlarged abdominal or pelvic lymph nodes. Reproductive: Status post hysterectomy. No adnexal masses. Other: Unchanged curvilinear hyperdensity in the pelvis on image 68/2. No significant abdominopelvic free fluid. Musculoskeletal: No acute osseous abnormality. IMPRESSION: 1. No acute abnormality in the abdomen or pelvis. 2. Scattered colonic diverticulosis without evidence of acute bowel inflammation. Electronically Signed   By: Reyes Holder M.D.   On: 01/10/2024 12:51   CT CORONARY MORPH W/CTA COR W/SCORE W/CA W/CM &/OR WO/CM Result Date: 01/07/2024 CLINICAL DATA:  Chest pain EXAM: Cardiac/Coronary  CTA TECHNIQUE: The patient was scanned on a Siemens Somatom scanner. : A prospective scan was triggered in the ascending thoracic aorta. Axial non-contrast 3 mm slices were carried out through the heart. The data set was analyzed on a dedicated work station and scored using the Agatson method. Gantry rotation speed was 66 msecs and collimation was .6 mm. 100mg  of metoprolol  and 0.8 mg of sl NTG was given. The 3D data set was reconstructed in 5% intervals of the 60-95 % of the R-R cycle. Diastolic phases were analyzed on a dedicated work station using MPR, MIP and VRT modes. The patient received 75 cc of contrast. FINDINGS: Aorta:  Normal size.  No calcifications.  No dissection. Aortic Valve:  Trileaflet.  No calcifications. Coronary Arteries:  Normal coronary origin.  Right dominance. RCA is a dominant artery. There is no plaque. Left main gives rise to LAD and LCX arteries. LM has no disease. LAD has calcified plaque proximally causing minimal stenosis (<25%). LCX is a non-dominant artery.  There is  no plaque. Other findings: Normal pulmonary vein drainage into the left atrium. Normal left atrial appendage without a thrombus. Normal size of the pulmonary artery. IMPRESSION: 1. Coronary calcium score of 39.3. This was 60th percentile for age and sex matched control. 2. Normal coronary origin with right dominance. 3. Minimal LCx stenosis (<25%). 4. CAD-RADS 1. Minimal non-obstructive CAD (0-24%). Consider non-atherosclerotic causes of chest pain. Consider preventive therapy and risk factor modification. Electronically Signed   By: Redell Cave M.D.   On: 01/07/2024 12:36

## 2024-01-13 NOTE — Assessment & Plan Note (Signed)
Recommend calcium and vitamin D supplementation. Osteopenia on DEXA August 2024,  FRAX 23% 10 year major fracture risk

## 2024-02-03 ENCOUNTER — Ambulatory Visit

## 2024-02-03 DIAGNOSIS — K2950 Unspecified chronic gastritis without bleeding: Secondary | ICD-10-CM | POA: Diagnosis not present

## 2024-02-03 DIAGNOSIS — K449 Diaphragmatic hernia without obstruction or gangrene: Secondary | ICD-10-CM | POA: Diagnosis not present

## 2024-02-03 DIAGNOSIS — K222 Esophageal obstruction: Secondary | ICD-10-CM | POA: Diagnosis not present

## 2024-03-16 ENCOUNTER — Ambulatory Visit: Admission: RE | Admit: 2024-03-16 | Discharge: 2024-03-16 | Attending: Oncology

## 2024-03-16 DIAGNOSIS — N6099 Unspecified benign mammary dysplasia of unspecified breast: Secondary | ICD-10-CM | POA: Diagnosis present

## 2024-03-16 MED ORDER — GADOBUTROL 1 MMOL/ML IV SOLN
7.0000 mL | Freq: Once | INTRAVENOUS | Status: AC | PRN
  Administered 2024-03-16: 6 mL via INTRAVENOUS

## 2024-04-21 ENCOUNTER — Ambulatory Visit: Admitting: Dermatology

## 2024-05-12 ENCOUNTER — Ambulatory Visit: Admitting: Dermatology

## 2024-05-12 ENCOUNTER — Encounter: Payer: Self-pay | Admitting: Dermatology

## 2024-05-12 DIAGNOSIS — L821 Other seborrheic keratosis: Secondary | ICD-10-CM | POA: Diagnosis not present

## 2024-05-12 DIAGNOSIS — L820 Inflamed seborrheic keratosis: Secondary | ICD-10-CM

## 2024-05-12 DIAGNOSIS — L918 Other hypertrophic disorders of the skin: Secondary | ICD-10-CM | POA: Diagnosis not present

## 2024-05-12 DIAGNOSIS — L578 Other skin changes due to chronic exposure to nonionizing radiation: Secondary | ICD-10-CM

## 2024-05-12 DIAGNOSIS — W908XXA Exposure to other nonionizing radiation, initial encounter: Secondary | ICD-10-CM | POA: Diagnosis not present

## 2024-05-12 NOTE — Patient Instructions (Signed)

## 2024-05-12 NOTE — Progress Notes (Signed)
" ° °  Follow-Up Visit   Subjective  Krystal Mcintosh is a 69 y.o. female who presents for the following: 8 month ISK follow up. Patient with new spots at scalp, lower right back that are bothersome.  The patient has spots, moles and lesions to be evaluated, some may be new or changing and the patient may have concern these could be cancer.  The following portions of the chart were reviewed this encounter and updated as appropriate: medications, allergies, medical history  Review of Systems:  No other skin or systemic complaints except as noted in HPI or Assessment and Plan.  Objective  Well appearing patient in no apparent distress; mood and affect are within normal limits.  A focused examination was performed of the following areas: Scalp, back  Relevant exam findings are noted in the Assessment and Plan. mostly scalp and face x 16 (16) Erythematous stuck-on, waxy papule or plaque R posterior waistline x 1 Fleshy, skin-colored pedunculated papules.    Assessment & Plan  INFLAMED SEBORRHEIC KERATOSIS (16) mostly scalp and face x 16 (16) Symptomatic, irritating, patient would like treated.  Benign-appearing.  Call clinic for new or changing lesions.   - Destruction of lesion - mostly scalp and face x 16 (16) Complexity: simple   Destruction method: cryotherapy   Informed consent: discussed and consent obtained   Timeout:  patient name, date of birth, surgical site, and procedure verified Lesion destroyed using liquid nitrogen: Yes   Region frozen until ice ball extended beyond lesion: Yes   Outcome: patient tolerated procedure well with no complications   Post-procedure details: wound care instructions given    SKIN TAG R posterior waistline x 1 Symptomatic, irritating, patient would like treated.  Benign-appearing.  Call clinic for new or changing lesions.   - Destruction of lesion - R posterior waistline x 1 Complexity: simple   Destruction method: cryotherapy    Informed consent: discussed and consent obtained   Timeout:  patient name, date of birth, surgical site, and procedure verified Lesion destroyed using liquid nitrogen: Yes   Region frozen until ice ball extended beyond lesion: Yes   Outcome: patient tolerated procedure well with no complications   Post-procedure details: wound care instructions given     SEBORRHEIC KERATOSIS - Stuck-on, waxy, tan-brown papules and/or plaques  - Benign-appearing - Discussed benign etiology and prognosis. - Observe - Call for any changes  ACTINIC DAMAGE - chronic, secondary to cumulative UV radiation exposure/sun exposure over time - diffuse scaly erythematous macules with underlying dyspigmentation - Recommend daily broad spectrum sunscreen SPF 30+ to sun-exposed areas, reapply every 2 hours as needed.  - Recommend staying in the shade or wearing long sleeves, sun glasses (UVA+UVB protection) and wide brim hats (4-inch brim around the entire circumference of the hat). - Call for new or changing lesions.  Return for TBSE, as scheduled, with Dr. MARLA, [facility]  LILLETTE Lonell Drones, RMA, am acting as scribe for Alm Rhyme, MD .   Documentation: I have reviewed the above documentation for accuracy and completeness, and I agree with the above.  Alm Rhyme, MD    "

## 2024-08-08 ENCOUNTER — Ambulatory Visit: Admitting: Dermatology

## 2024-08-25 ENCOUNTER — Ambulatory Visit: Admitting: Dermatology

## 2025-01-12 ENCOUNTER — Ambulatory Visit: Admitting: Oncology
